# Patient Record
Sex: Female | Born: 1965 | Race: White | Hispanic: No | Marital: Married | Smoking: Never smoker
Health system: Southern US, Community
[De-identification: ages and names within clinical notes are randomized; demographics above are authoritative.]

## PROBLEM LIST (undated history)

## (undated) DIAGNOSIS — D509 Iron deficiency anemia, unspecified: Secondary | ICD-10-CM

## (undated) DIAGNOSIS — F329 Major depressive disorder, single episode, unspecified: Secondary | ICD-10-CM

## (undated) DIAGNOSIS — M549 Dorsalgia, unspecified: Secondary | ICD-10-CM

## (undated) HISTORY — DX: Dorsalgia, unspecified: M54.9

## (undated) HISTORY — DX: Major depressive disorder, single episode, unspecified: F32.9

## (undated) HISTORY — DX: Iron deficiency anemia, unspecified: D50.9

---

## 1999-09-10 ENCOUNTER — Encounter (INDEPENDENT_AMBULATORY_CARE_PROVIDER_SITE_OTHER): Payer: Self-pay | Admitting: Specialist

## 1999-09-10 ENCOUNTER — Inpatient Hospital Stay (HOSPITAL_COMMUNITY): Admission: AD | Admit: 1999-09-10 | Discharge: 1999-09-13 | Payer: Self-pay | Admitting: *Deleted

## 2003-09-09 ENCOUNTER — Encounter: Payer: Self-pay | Admitting: Family Medicine

## 2007-07-30 LAB — HM MAMMOGRAPHY

## 2007-07-30 LAB — CONVERTED CEMR LAB: Pap Smear: NORMAL

## 2009-08-24 ENCOUNTER — Ambulatory Visit: Payer: Self-pay | Admitting: Family Medicine

## 2009-08-24 DIAGNOSIS — F3289 Other specified depressive episodes: Secondary | ICD-10-CM

## 2009-08-24 DIAGNOSIS — F329 Major depressive disorder, single episode, unspecified: Secondary | ICD-10-CM

## 2009-08-24 DIAGNOSIS — D509 Iron deficiency anemia, unspecified: Secondary | ICD-10-CM

## 2009-08-24 HISTORY — DX: Other specified depressive episodes: F32.89

## 2009-08-24 HISTORY — DX: Major depressive disorder, single episode, unspecified: F32.9

## 2009-08-24 HISTORY — DX: Iron deficiency anemia, unspecified: D50.9

## 2009-08-31 ENCOUNTER — Telehealth: Payer: Self-pay | Admitting: Family Medicine

## 2009-09-15 ENCOUNTER — Telehealth: Payer: Self-pay | Admitting: Family Medicine

## 2009-10-07 ENCOUNTER — Emergency Department (HOSPITAL_COMMUNITY): Admission: EM | Admit: 2009-10-07 | Discharge: 2009-10-07 | Payer: Self-pay | Admitting: Emergency Medicine

## 2009-10-14 ENCOUNTER — Ambulatory Visit: Payer: Self-pay | Admitting: Family Medicine

## 2009-10-14 DIAGNOSIS — M549 Dorsalgia, unspecified: Secondary | ICD-10-CM | POA: Insufficient documentation

## 2009-10-14 HISTORY — DX: Dorsalgia, unspecified: M54.9

## 2009-10-27 ENCOUNTER — Telehealth: Payer: Self-pay | Admitting: Family Medicine

## 2009-11-15 ENCOUNTER — Encounter: Admission: RE | Admit: 2009-11-15 | Discharge: 2009-11-15 | Payer: Self-pay | Admitting: Obstetrics and Gynecology

## 2010-08-30 NOTE — Progress Notes (Signed)
Summary: FYI  Phone Note Call from Patient   Caller: Patient Call For: Kristina Peat MD Summary of Call: Pt has decided to say on the 20 mg. instead of the 10 th. Initial call taken by: Lynann Beaver CMA,  September 15, 2009 9:30 AM  Follow-up for Phone Call        noted Follow-up by: Kristina Peat MD,  September 15, 2009 12:36 PM

## 2010-08-30 NOTE — Progress Notes (Signed)
Summary: Prozac lower dose med request  Phone Note Call from Patient Call back at Home Phone [phone]   Caller: [facility] mail Summary of Call: Was seen recently and was rx'd  Generic Prozac 20mg .  She thinks that the mg is too much and she is not sleeping very well. She is requesting a new rx  10 mg dosage. Her pharmacy is [facility]. Initial call taken by: Warnell Forester,  August 31, 2009 9:02 AM  Follow-up for Phone Call        OK to reduce to 10 mg dose and refill for 6 months. Follow-up by: Evelena Peat MD,  August 31, 2009 10:28 AM  Additional Follow-up for Phone Call Additional follow up Details #1::        Rx change sent to pharmacy, pt informed Additional Follow-up by: Sid Falcon LPN,  August 31, 2009 11:18 AM    New/Updated Medications: FLUOXETINE HCL 10 MG CAPS (FLUOXETINE HCL) one tab daily Prescriptions: FLUOXETINE HCL 10 MG CAPS (FLUOXETINE HCL) one tab daily  #30 x 6   Entered by:   Sid Falcon LPN   Authorized by:   Evelena Peat MD   Signed by:   Sid Falcon LPN on 91/47/8295   Method used:   Electronically to        [facility]. [phone]* (retail)       [address]       [place], [place]  [address]       Ph: [phone]       Fax: [phone]   RxID:   [id number]
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
## 2010-08-30 NOTE — Assessment & Plan Note (Signed)
Summary: new to est//ccm   Vital Signs:  Patient profile:   45 year old female Menstrual status:  regular LMP:     08/18/2009 Height:      61.50 inches Weight:      127 pounds BMI:     23.69 Temp:     99.2 degrees F oral Pulse rate:   72 / minute Pulse rhythm:   regular Resp:     12 per minute BP sitting:   110 / 72  (left arm) Cuff size:   regular  Vitals Entered By: Sid Falcon LPN (August 24, 2009 9:09 AM) CC: New pt to establish, stress issues LMP (date): 08/18/2009     Menstrual Status regular Enter LMP: 08/18/2009 Last PAP Result normal   History of Present Illness: New patient to establish care.  Past history of depression which was treated several years ago. Recent increased stressors. She was remarried 2 years ago has 3 children and 2 stepchildren. Very stressful job. Feels overloaded at times. Previously treated with fluoxetine and would like to consider going back on that. Improved ability to manage stress in the past with that medication.  has had some recent early morning awakening. No suicidal ideation.  Remote hx of iron defic anemia during pregnancy.  No recent dizziness.  Prior C-section x3. Allergy to penicillin. No current medications. Family history significant for father having pituitary tumor. Grandparents with stroke.  Patient remarried 2008. No history of smoking. Occasional alcohol use.  Preventive Screening-Counseling & Management  Alcohol-Tobacco     Smoking Status: never  Caffeine-Diet-Exercise     Does Patient Exercise: no  Allergies (verified): 1)  Penicillin V Potassium (Penicillin V Potassium)  Past History:  Family History: Last updated: 08/24/2009 Family History of Alcoholism/Addiction, grandfather Family History of Arthritis, grandmother Stroke, grandmother, great grandfather  Social History: Last updated: 08/24/2009 Occupation: Married Never Smoked Alcohol use-yes Regular exercise-no Divorced 2005, remarried  2008  Risk Factors: Exercise: no (08/24/2009)  Risk Factors: Smoking Status: never (08/24/2009)  Past Medical History: Anemia-iron deficiency Depression Testing for HIV  Family History: Family History of Alcoholism/Addiction, grandfather Family History of Arthritis, grandmother Stroke, grandmother, great grandfather  Social History: Occupation: Married Never Smoked Alcohol use-yes Regular exercise-no Divorced 2005, remarried 2008 Occupation:  employed Smoking Status:  never Does Patient Exercise:  no  Review of Systems  The patient denies anorexia, fever, weight loss, weight gain, vision loss, hoarseness, chest pain, syncope, dyspnea on exertion, peripheral edema, prolonged cough, headaches, hemoptysis, abdominal pain, melena, hematochezia, and severe indigestion/heartburn.    Physical Exam  General:  Well-developed,well-nourished,in no acute distress; alert,appropriate and cooperative throughout examination Eyes:  No corneal or conjunctival inflammation noted. EOMI. Perrla. Funduscopic exam benign, without hemorrhages, exudates or papilledema. Vision grossly normal. Ears:  External ear exam shows no significant lesions or deformities.  Otoscopic examination reveals clear canals, tympanic membranes are intact bilaterally without bulging, retraction, inflammation or discharge. Hearing is grossly normal bilaterally. Mouth:  Oral mucosa and oropharynx without lesions or exudates.  Teeth in good repair. Neck:  No deformities, masses, or tenderness noted. Lungs:  Normal respiratory effort, chest expands symmetrically. Lungs are clear to auscultation, no crackles or wheezes. Heart:  Normal rate and regular rhythm. S1 and S2 normal without gallop, murmur, click, rub or other extra sounds. Psych:  normally interactive, good eye contact, and not anxious appearing.     Impression & Recommendations:  Problem # 1:  DEPRESSION (ICD-311) get back on prozac.  Discussed counseling but  she is  not interested at this time. Her updated medication list for this problem includes:    Fluoxetine Hcl 20 Mg Caps (Fluoxetine hcl) ..... One by mouth once daily  Problem # 2:  ANEMIA-IRON DEFICIENCY (ICD-280.9) Pt sees gyn and will get rechecked periodically there.  Complete Medication List: 1)  Fluoxetine Hcl 20 Mg Caps (Fluoxetine hcl) .... One by mouth once daily  Patient Instructions: 1)  It is important that you exercise reguarly at least 20 minutes 5 times a week. If you develop chest pain, have severe difficulty breathing, or feel very tired, stop exercising immediately and seek medical attention.  Prescriptions: FLUOXETINE HCL 20 MG CAPS (FLUOXETINE HCL) one by mouth once daily  #30 x 11   Entered and Authorized by:   Evelena Peat MD   Signed by:   Evelena Peat MD on 08/24/2009   Method used:   Electronically to        [facility]. [phone]* (retail)       [address]       [place], [place]  [address]       Ph: [phone]       Fax: [phone]   RxID:   [id number]   Preventive Care Screening  Mammogram:    Date:  07/30/2007    Results:  normal   Pap Smear:    Date:  07/30/2007    Results:  normal
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
## 2010-12-14 NOTE — Discharge Summary (Signed)
[facility]  Patient:    ALLEYReesha, Debes                        MRN: [id number] Adm. Date:  [id number] Disc. Date: [id number] Attending:  Donne Hazel Dictator:   Danie Chandler, R.N.                           Discharge Summary  ADMISSION DIAGNOSES:          1. Intrauterine pregnancy at term.                               2. Repeat cesarean section.                               3. Request permanent voluntary sterilization.  DISCHARGE DIAGNOSES:          1. Intrauterine pregnancy at term.                               2. Repeat cesarean section.                               3. Request permanent voluntary sterilization.                               4. Anemia.  PROCEDURE:                    On September 10, 1999, repeat low transverse cesarean                               section and bilateral tubal ligation.  REASON FOR ADMISSION:         The patient is a 44 year old married white female, gravida 4, para 2, at term, who is admitted for repeat cesarean delivery.  The patient has had two previous cesarean sections and declined a vaginal birth after cesarean; she also has requested voluntary sterilization at the time of the cesarean section.  HOSPITAL COURSE:              The patient is taken to the operating room and undergoes the above-named procedure without complication.  This is productive of a viable female infant with Apgars of 9 at one minute and 9 at five minutes. Postoperatively, on day #1, the patients only complaint was incisional pain. She had a good return of bowel function.  She was ambulating well without difficulty. Her hemoglobin on this day was 5.5, with a preoperative hemoglobin of 7.7.  Her  hematocrit was 18.1 and white blood cell count 7.9.  The patient was asymptomatic of the anemia.  She was started on iron b.i.d. and had a repeat CBC ordered for  September 12, 1999.  On postoperative day #2, the patient was  ambulating well without difficulty and tolerating a regular diet.  Her hemoglobin was stable at  5.4.  She was continued on iron and on postoperative day #3, the patient was discharged home in stable condition.  Her hemoglobin on this day was 6.1.  CONDITION ON DISCHARGE:  Good.  DIET:                         Regular as tolerated.  ACTIVITY:                     No heavy lifting, no driving, no vaginal entry.  FOLLOWUP:                     She is to follow up in the office in one to two weeks for incision check and she is to call for temperature greater than 100 degrees,  persistent nausea or vomiting, heavy vaginal bleeding and/or redness or drainage from the incision site.  DISCHARGE MEDICATIONS:        1. Prenatal vitamin one p.o. q.d.                               2. Tylox one to two every four hours as needed for                                  pain.                               3. Nu-Iron 150 mg one three times a day with meals. DD:  09/13/99 TD:  09/14/99 Job: [id number]

## 2010-12-14 NOTE — Op Note (Signed)
[facility]  Patient:    Kristina Sanchez, Kristina Sanchez                        MRN: [id number] Proc. Date: 09/10/99 Adm. Date:  [id number] Attending:  Donne Hazel                           Operative Report  PREOPERATIVE DIAGNOSIS:       Intrauterine pregnancy at term.  Repeat cesarean section.  Request permanent, voluntary sterilization.  POSTOPERATIVE DIAGNOSIS:      Intrauterine pregnancy at term.  Repeat cesarean section.  Request permanent, voluntary sterilization.  OPERATION:                    Repeat low transverse cesarean section. Bilateral tubal ligation.  SURGEON:                      Willey Blade, M.D.  ASSISTANT:                    [name], M.D.  ANESTHESIA:                   Spinal.  ESTIMATED BLOOD LOSS:         800 cc.  COMPLICATIONS:                None.  FINDINGS:                     At 22 through a low transverse uterine incision, a viable female infant was delivered without difficulty.  Apgars were 9 and 9. Weight is pending.  A bilateral tubal ligation was performed.  The pelvis was visualized at time of  surgery and otherwise noted to be normal.  DESCRIPTION OF PROCEDURE:     The patient was taken to the operating room where a spinal anesthetic was administered.  The patient was placed on the operating table in the left lateral tilt position.  The abdomen was prepped and draped in the usual sterile fashion with Betadine and sterile drapes.  A Foley catheter was sterilely inserted.  The abdomen was entered through a Pfannenstiel incision and carried own sharply in the usual fashion.  The peritoneum was atraumatically entered.  The vesicouterine peritoneum overlying the lower uterine segment was incised and a bladder flap was bluntly and sharply created over the lower uterine segment.  A  bladder blade was then placed behind the bladder.  The uterus was then entered through a low transverse incision and  carried out laterally using the operators  fingers.  The vertex was elevated into the incision and delivered promptly at 0855. The fluid was clear.  The oral and nasopharynx was thoroughly bulb suctioned. he cord was doubly clamped and cut and the baby handed promptly to the pediatricians. The baby did well and was a female with Apgars of 9 and 9.  Weight is pending. The placenta was then manually extracted intact with three vessel cord without difficulty and the interior of the uterus was wiped clean thoroughly with a wet  sponge.  The uterine incision was then closed in a two layer fashion, the first layer a running interlocking suture of #1 Vicryl.  A second imbricating suture was placed across the primary suture line with a running stitch of #1 Vicryl as well. Good hemostasis from the incision  was noted.  Bilateral tubal ligation was then performed.  The right tube was first identified and traced to its fimbriated end to ensure its positive identification.  The tube was then grasped in its midportion and through an avascular region in the mesosalpinx, a small hole was burned with the bipolar cautery.  Two sutures of 1 plain suture was then passed and an approximate 2.5 cm segment of tube was then  tied off.  An approximate 2 cm segment of tube was then excised between the existing ligatures of #1 plain.  Next, the tubal ends were cauterized using the  Bovie cautery.  A single suture of 0 silk was placed on the medial tubal stump.  The same procedure was thus repeated upon the left tube.  The pelvis was thoroughly irrigated with copious amounts of irrigant and good hemostasis was noted from all of the operative areas.  The rectus muscle and fascia was closed with a running  stitch of #1 Vicryl.  The subfascial areas were hemostatic.  The fascia was then closed with two sutures of #1 Vicryl, this was done by placing two sutures at the periphery of the incision and with  two running stitches meeting in the midline.  The subcutaneous tissue was irrigated and made hemostatic using the Bovie cautery. The skin reapproximated with staples and a sterile dressing applied.  Final sponge, needle, and instrument counts were correct x 3.  There were no perioperative complications.  The patient did receive Cefotan after cord clamp.  There were no perioperative complications. DD:  09/10/99 TD:  09/10/99 Job: [id number]

## 2010-12-14 NOTE — H&P (Signed)
[facility]  Patient:    Kristina Sanchez, Kristina Sanchez                        MRN: [id number] Adm. Date:  [id number] Attending:  Donne Hazel                         History and Physical  HISTORY OF PRESENT ILLNESS:   Ms. Kristina Sanchez is a 45 year old married female, gravida 4, para 2, A1, at term, admitted for repeat cesarean delivery.  The patient has had two previous cesarean sections and declined a vaginal birth after cesarean. She also requested a voluntary sterilization at time of her cesarean section.  MEDICAL HISTORY:              History of anemia.  SURGICAL HISTORY:             Cesarean section x 2.  OBSTETRICAL HISTORY:          1. TAB, 1993.                               2. Cesarean section in 1996 for CPD.                               3. A repeat cesarean section, 1998.  CURRENT MEDICATIONS:          Prenatal vitamins.  ALLERGIES:                    None known.  PHYSICAL EXAMINATION:  VITAL SIGNS:                  Stable -- temperature 97.3, pulse 86, respirations 20, blood pressure 127/72.  Fetal heart tones 140s.  GENERAL:                      She is a well-developed, well-nourished white female in no acute distress.  HEENT:                        Within normal limits.  NECK:                         Supple without adenopathy or thyromegaly.  HEART:                        Regular rate and rhythm, without murmur, gallop or rub.  LUNGS:                        Clear to auscultation.  BREASTS:                      Exam is deferred but has been normal within the last year.  ABDOMEN:                      Gravid, nontender, without masses or hernia.  EXTREMITIES:                  Grossly normal.  NEUROLOGIC:                   Grossly normal.  ADMITTING DIAGNOSES:  1. Intrauterine pregnancy at term.                               2. Repeat cesarean section.                               3. Declines vaginal birth after cesarean  section.                               4. Anemia.                               5. Requested permanent, voluntary sterilization.  PLAN:                         1. Repeat low transverse cesarean section.                               2. Bilateral tubal ligation.  DISCUSSION:                   Risks and benefits of surgery are explained to patient at length; also, the risk of tubal ligation explained.  A failure rate f 1 in 200 quoted.  The patient was allowed to ask questions and wished to proceed. DD:  09/10/99 TD:  09/10/99 Job: [id number]

## 2011-05-03 ENCOUNTER — Ambulatory Visit (INDEPENDENT_AMBULATORY_CARE_PROVIDER_SITE_OTHER): Payer: BC Managed Care – PPO | Admitting: Family Medicine

## 2011-05-03 ENCOUNTER — Encounter: Payer: Self-pay | Admitting: Family Medicine

## 2011-05-03 VITALS — BP 110/70 | Temp 98.1°F | Ht 62.0 in | Wt 133.0 lb

## 2011-05-03 DIAGNOSIS — M546 Pain in thoracic spine: Secondary | ICD-10-CM

## 2011-05-03 MED ORDER — CYCLOBENZAPRINE HCL 5 MG PO TABS: 5.0000 mg | ORAL_TABLET | Freq: Three times a day (TID) | ORAL | Status: AC | PRN

## 2011-05-03 NOTE — Patient Instructions (Signed)
Continue with topical heat, ibuprofen, and muscle massage.

## 2011-05-03 NOTE — Progress Notes (Signed)
  Subjective:    Patient ID: Kristina Sanchez, female    DOB: 05/31/1966, 45 y.o.   MRN: [id number]  HPI Upper back pain. Onset last week. Pain mostly left upper thoracic region. No injury. 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 No radiculopathy symptoms. No upper extremity weakness. Has not taken any medications. Increased muscle tension. Has previously received massages but not recently.   Review of Systems  Constitutional: Negative for fever and chills.  Respiratory: Negative for cough and shortness of breath.   Musculoskeletal: Positive for back pain.  Skin: Negative for rash.       Objective:   Physical Exam  Constitutional: She appears well-developed and well-nourished.  Cardiovascular: Normal rate and regular rhythm.   Pulmonary/Chest: Effort normal and breath sounds normal. No respiratory distress. She has no wheezes. She has no rales.  Musculoskeletal:       No spinal tenderness. Patient has some muscular tenderness and palpable muscle tension just medial to left scapula          Assessment & Plan:  Muscular pain left upper thoracic region. Try heat, ibuprofen, low-dose cyclobenzaprine, and muscle relaxer.

## 2011-10-26 ENCOUNTER — Ambulatory Visit (INDEPENDENT_AMBULATORY_CARE_PROVIDER_SITE_OTHER): Payer: BC Managed Care – PPO | Admitting: Family Medicine

## 2011-10-26 ENCOUNTER — Encounter: Payer: Self-pay | Admitting: Family Medicine

## 2011-10-26 VITALS — BP 110/84 | Temp 98.6°F | Wt 131.0 lb

## 2011-10-26 DIAGNOSIS — M25511 Pain in right shoulder: Secondary | ICD-10-CM

## 2011-10-26 DIAGNOSIS — M25519 Pain in unspecified shoulder: Secondary | ICD-10-CM

## 2011-10-26 NOTE — Patient Instructions (Signed)
Rotator Cuff Tendonitis  The rotator cuff is the collection of all the muscles and tendons (the supraspinatus, infraspinatus, subscapularis, and teres minor muscles and their tendons) that help your shoulder stay in place. This unit holds the head of the upper arm bone (humerus) in the cup (fossa) of the shoulder blade (scapula). Basically, it connects the arm to the shoulder. Tendinitis is a swelling and irritation of the tissue, called cord like structures (tendons) that connect muscle to bone. It usually is caused by overusing the joint involved. When the tissue surrounding a tendon (the synovium) becomes inflamed, it is called tenosynovitis. This also is often the result of overuse in people whose jobs require repetitive (over and over again) types of motion. HOME CARE INSTRUCTIONS   Use a sling or splint for as long as directed by your caregiver until the pain decreases.   Apply ice to the injury for 15 to 20 minutes, 3 to 4 times per day. Put the ice in a plastic bag and place a towel between the bag of ice and your skin.   Try to avoid use other than gentle range of motion while your shoulder is painful. Use and exercise only as directed by your caregiver. Stop exercises or range of motion if pain or discomfort increases, unless directed otherwise by your caregiver.   Only take over-the-counter or prescription medicines for pain, discomfort, or fever as directed by your caregiver.   If you were give a shoulder sling and straps (immobilizer), do not remove it except as directed, or until you see a caregiver for a follow-up examination. If you need to remove it, move your arm as little as possible or as directed.   You may want to sleep on several pillows at night to lessen swelling and pain.  SEEK IMMEDIATE MEDICAL CARE IF:   Pain in your shoulder increases or new pain develops in your arm, hand, or fingers and is not relieved with medications.   You develop new, unexplained symptoms,  especially increased numbness in the hands or loss of strength, or you develop any worsening of the problems which brought you in for care.   Your arm, hand, or fingers are numb or tingling.   Your arm, hand, or fingers are swollen, painful, or turn white or blue.  Document Released: 10/05/2003 Document Revised: 07/04/2011 Document Reviewed: 05/12/2008 [employer] Patient Information 2012 [employer]. 

## 2011-10-26 NOTE — Progress Notes (Signed)
  Subjective:    Patient ID: Kristina Sanchez, female    DOB: 02-Aug-1965, 46 y.o.   MRN: [id number]  HPI  Patient seen with right shoulder and upper arm pain for one month. No injury. No neck pain. Denies any numbness or weakness. Pain with abduction of shoulder predominately. Pain is sharp and occasional burning type quality mostly radiating to deltoid and occasionally below the elbow. Pain is relatively constant. Worse with abduction and somewhat external rotation. No clear weakness. She's tried occasional Aleve and Advil without relief   Review of Systems  HENT: Negative for neck pain.   Cardiovascular: Negative for chest pain.  Skin: Negative for rash.  Neurological: Negative for weakness and numbness.  Hematological: Negative for adenopathy.       Objective:   Physical Exam  Constitutional: She appears well-developed and well-nourished.  Cardiovascular: Normal rate and regular rhythm.   Pulmonary/Chest: Effort normal and breath sounds normal. No respiratory distress. She has no wheezes. She has no rales.  Musculoskeletal:       Patient has good range of motion right shoulder but pain with abduction greater than 90. Nonspecific tenderness right proximal humerus region. No ecchymosis. No erythema. No edema.  Neurological:       No rotator cuff weakness appreciated          Assessment & Plan:  Right shoulder pain. Suspect rotator cuff tendinitis.  Discussed risks and benefits of corticosteroid injection and patient consented.  After prepping skin with betadine, injected 40 mg depomedrol and 2 cc of plain xylocaine with 23 gauge one and one half inch needle using posterior lateral approach and pt tolerate well.  Gentle range of motion and touch base in one week if no better.  Consider cervical spine evaluation if no improvement with injection.

## 2011-11-07 ENCOUNTER — Telehealth: Payer: Self-pay | Admitting: Family Medicine

## 2011-11-07 DIAGNOSIS — M25519 Pain in unspecified shoulder: Secondary | ICD-10-CM

## 2011-11-07 NOTE — Telephone Encounter (Signed)
Pt went to the Saturday clinic 2 weeks ago, but her shoulder has continued to hurt, and thinks it is getting worse.

## 2011-11-07 NOTE — Telephone Encounter (Signed)
I called pt back as requested, she is having trouble reaching into her closet, she is open to a referral if Dr Caryl Never feels that is the next step.

## 2011-11-07 NOTE — Telephone Encounter (Signed)
Pt called and said that the cortisone shot only helped for a few hrs and that's all. Pt said that she is still having pain in upper arm and shoulder. Pt can't reach for anything or extend her arm. Pt req call back from nurse.

## 2011-11-08 NOTE — Telephone Encounter (Signed)
May have cervical nerve root impingement.  I will go ahead with ortho referral and let pt know.

## 2011-11-08 NOTE — Telephone Encounter (Signed)
Pt informed, she has a new insurance card and wanted to fax a copy, she will attention it to [place]. FYI

## 2012-06-04 ENCOUNTER — Ambulatory Visit (INDEPENDENT_AMBULATORY_CARE_PROVIDER_SITE_OTHER): Payer: BC Managed Care – PPO | Admitting: Family Medicine

## 2012-06-04 ENCOUNTER — Encounter: Payer: Self-pay | Admitting: Family Medicine

## 2012-06-04 VITALS — BP 110/74 | Temp 100.3°F | Wt 128.0 lb

## 2012-06-04 DIAGNOSIS — J029 Acute pharyngitis, unspecified: Secondary | ICD-10-CM

## 2012-06-04 MED ORDER — HYDROCODONE-HOMATROPINE 5-1.5 MG/5ML PO SYRP: 5.0000 mL | ORAL_SOLUTION | Freq: Four times a day (QID) | ORAL | Status: AC | PRN

## 2012-06-04 NOTE — Patient Instructions (Addendum)

## 2012-06-04 NOTE — Progress Notes (Signed)
Subjective:     Patient ID: Kristina Sanchez, female   DOB: Nov 02, 1965, 46 y.o.   MRN: [id number]  HPI 46 year old with 1 day history of cough, sore throat, and body aches.  States that cough began ~5pm yesterday, followed by sore throat, with hip, knee and thigh muscle aches.  Did not check temperature at home, but has noted periods of heat and cold intolerance.  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  Denies SOB, CP, nausea, vomiting, diarrhea.  Has not had flu shot this year.  Review of Systems  Constitutional: Positive for fever and chills.  HENT: Positive for sore throat.   Respiratory: Positive for cough.   Musculoskeletal: Positive for myalgias and arthralgias.       Objective:   Physical Exam  Constitutional: She appears well-developed and well-nourished.  HENT:  Head: Normocephalic and atraumatic.  Right Ear: External ear normal.  Left Ear: External ear normal.  Mouth/Throat: Oropharynx is clear and moist.       Few scattered petechiae on soft palate.  Neck: Neck supple.  Cardiovascular: Normal rate, regular rhythm and normal heart sounds.   Pulmonary/Chest: Effort normal and breath sounds normal. No respiratory distress. She has no wheezes. She has no rales.  Lymphadenopathy:    She has no cervical adenopathy.       Assessment:     46 year old with 1 day history of cough, sore throat and body aches.    Plan:     1. Cough/sore throat: likely viral URI.  Despite not having flu shot, still early in season to be the flu.  Pt also had sick contacts at jury duty earlier this week.  Recommend plenty of clear fluids, symptomatic relief of cough, arthralgias/myalgias, and fever.  Rx Hycodan for nighttime cough suppression, and can take ibuprofen or acetaminophen for fever reduction.    Marthann Schiller, MS3     Agree with assessment and plan as per Marthann Schiller, MS 3 Evelena Peat MD

## 2013-11-08 ENCOUNTER — Ambulatory Visit (INDEPENDENT_AMBULATORY_CARE_PROVIDER_SITE_OTHER): Payer: BC Managed Care – PPO | Admitting: Family Medicine

## 2013-11-08 ENCOUNTER — Encounter: Payer: Self-pay | Admitting: Family Medicine

## 2013-11-08 VITALS — BP 110/64 | HR 82 | Temp 98.2°F | Wt 130.0 lb

## 2013-11-08 DIAGNOSIS — J069 Acute upper respiratory infection, unspecified: Secondary | ICD-10-CM

## 2013-11-08 DIAGNOSIS — B9789 Other viral agents as the cause of diseases classified elsewhere: Principal | ICD-10-CM

## 2013-11-08 MED ORDER — HYDROCODONE-HOMATROPINE 5-1.5 MG/5ML PO SYRP: 5.0000 mL | ORAL_SOLUTION | Freq: Four times a day (QID) | ORAL | Status: AC | PRN

## 2013-11-08 NOTE — Patient Instructions (Signed)
Acute Bronchitis Bronchitis is inflammation of the airways that extend from the windpipe into the lungs (bronchi). The inflammation often causes mucus to develop. This leads to a cough, which is the most common symptom of bronchitis.  In acute bronchitis, the condition usually develops suddenly and goes away over time, usually in a couple weeks. Smoking, allergies, and asthma can make bronchitis worse. Repeated episodes of bronchitis may cause further lung problems.  CAUSES Acute bronchitis is most often caused by the same virus that causes a cold. The virus can spread from person to person (contagious).  SIGNS AND SYMPTOMS   Cough.   Fever.   Coughing up mucus.   Body aches.   Chest congestion.   Chills.   Shortness of breath.   Sore throat.  DIAGNOSIS  Acute bronchitis is usually diagnosed through a physical exam. Tests, such as chest X-rays, are sometimes done to rule out other conditions.  TREATMENT  Acute bronchitis usually goes away in a couple weeks. Often times, no medical treatment is necessary. Medicines are sometimes given for relief of fever or cough. Antibiotics are usually not needed but may be prescribed in certain situations. In some cases, an inhaler may be recommended to help reduce shortness of breath and control the cough. A cool mist vaporizer may also be used to help thin bronchial secretions and make it easier to clear the chest.  HOME CARE INSTRUCTIONS  Get plenty of rest.   Drink enough fluids to keep your urine clear or pale yellow (unless you have a medical condition that requires fluid restriction). Increasing fluids may help thin your secretions and will prevent dehydration.   Only take over-the-counter or prescription medicines as directed by your health care provider.   Avoid smoking and secondhand smoke. Exposure to cigarette smoke or irritating chemicals will make bronchitis worse. If you are a smoker, consider using nicotine gum or skin  patches to help control withdrawal symptoms. Quitting smoking will help your lungs heal faster.   Reduce the chances of another bout of acute bronchitis by washing your hands frequently, avoiding people with cold symptoms, and trying not to touch your hands to your mouth, nose, or eyes.   Follow up with your health care provider as directed.  SEEK MEDICAL CARE IF: Your symptoms do not improve after 1 week of treatment.  SEEK IMMEDIATE MEDICAL CARE IF:  You develop an increased fever or chills.   You have chest pain.   You have severe shortness of breath.  You have bloody sputum.   You develop dehydration.  You develop fainting.  You develop repeated vomiting.  You develop a severe headache. MAKE SURE YOU:   Understand these instructions.  Will watch your condition.  Will get help right away if you are not doing well or get worse. Document Released: 08/22/2004 Document Revised: 03/17/2013 Document Reviewed: 01/05/2013 [employer] Patient Information 2014 [employer]  

## 2013-11-08 NOTE — Progress Notes (Signed)
   Subjective:    Patient ID: Kristina Sanchez, female    DOB: 02/16/1966, 48 y.o.   MRN: [id number]  Cough Pertinent negatives include no chills, fever or sore throat.   Acute visit. Patient seen with about 4 day history of cough, nasal congestion, increased fatigue. No reported fever or chills. Cough has been mostly nonproductive and worse at night. She's tried over-the-counter cough medications without much improvement. She initially thought this may been allergies but she has taken Zyrtec and Claritin without relief. Hycodan which did help her nighttime cough. She has generally not had a history of spring allergies. Nonsmoker.  Past Medical History  Diagnosis Date  . ANEMIA-IRON DEFICIENCY 08/24/2009    Qualifier: Diagnosis of  By: Caryl NeverBurchette MD, Chany Woolworth    . DEPRESSION 08/24/2009    Qualifier: Diagnosis of  By: Caryl NeverBurchette MD, Nyheem Binette    . BACK PAIN, UPPER 10/14/2009    Qualifier: Diagnosis of  By: Caryl NeverBurchette MD, Zavannah Deblois     No past surgical history on file.  reports that she has never smoked. She does not have any smokeless tobacco history on file. Her alcohol and drug histories are not on file. family history includes Alcohol abuse in her other; Arthritis in her other; Stroke in her other. Allergies  Allergen Reactions  . Penicillins     REACTION: hives      Review of Systems  Constitutional: Positive for fatigue. Negative for fever and chills.  HENT: Positive for congestion. Negative for sore throat.   Respiratory: Positive for cough.        Objective:   Physical Exam  Constitutional: She appears well-developed and well-nourished.  HENT:  Right Ear: External ear normal.  Left Ear: External ear normal.  Mouth/Throat: Oropharynx is clear and moist.  Neck: Neck supple.  Cardiovascular: Normal rate.   Pulmonary/Chest: Effort normal and breath sounds normal. No respiratory distress. She has no wheezes. She has no rales.  Lymphadenopathy:    She has no cervical adenopathy.           Assessment & Plan:  Viral URI with cough.  Non focal exam.  Hycodan cough syrup prn severe cough at night.

## 2013-11-08 NOTE — Progress Notes (Signed)
Pre visit review using our clinic review tool, if applicable. No additional management support is needed unless otherwise documented below in the visit note. 

## 2015-06-28 ENCOUNTER — Ambulatory Visit (INDEPENDENT_AMBULATORY_CARE_PROVIDER_SITE_OTHER): Payer: BLUE CROSS/BLUE SHIELD | Admitting: Family Medicine

## 2015-06-28 VITALS — BP 120/84 | HR 85 | Temp 99.0°F | Resp 16 | Ht 62.0 in | Wt 138.4 lb

## 2015-06-28 DIAGNOSIS — M546 Pain in thoracic spine: Secondary | ICD-10-CM | POA: Diagnosis not present

## 2015-06-28 DIAGNOSIS — M549 Dorsalgia, unspecified: Secondary | ICD-10-CM

## 2015-06-28 DIAGNOSIS — L720 Epidermal cyst: Secondary | ICD-10-CM

## 2015-06-28 DIAGNOSIS — H9202 Otalgia, left ear: Secondary | ICD-10-CM

## 2015-06-28 DIAGNOSIS — L989 Disorder of the skin and subcutaneous tissue, unspecified: Secondary | ICD-10-CM | POA: Diagnosis not present

## 2015-06-28 NOTE — Progress Notes (Signed)
   Subjective:    Patient ID: Kristina Sanchez, female    DOB: 01/13/1966, 49 y.o.   MRN: [id number]  HPI Patient here today for multiple issues  Left earache. Onset about 4 days ago. She's had some mild nasal congestion. She takes Aleve which helps. No ear drainage. No hearing changes. No dizziness or vertigo. Denies any fever or chills. Has had some mild sore throat and occasional dry cough.  Nodular skin lesion left temporal area of face. Present for several months. Couple episodes of reported bleeding without any provoking trauma. No personal history of skin cancer.  Cystic lesion mid thoracic back. No pain. No drainage.  Chronic left upper back pain. Occasional radiation toward base of neck. No radiculopathy symptoms. She's tried multiple things including heat, ice, muscle massage without relief. Pain somewhat poorly localized.  Past Medical History  Diagnosis Date  . ANEMIA-IRON DEFICIENCY 08/24/2009    Qualifier: Diagnosis of  By: Caryl NeverBurchette MD, Zakkiyya Barno    . DEPRESSION 08/24/2009    Qualifier: Diagnosis of  By: Caryl NeverBurchette MD, Geremy Rister    . BACK PAIN, UPPER 10/14/2009    Qualifier: Diagnosis of  By: Caryl NeverBurchette MD, Ariana Cavenaugh     No past surgical history on file.  reports that she has never smoked. She does not have any smokeless tobacco history on file. Her alcohol and drug histories are not on file. family history includes Alcohol abuse in her other; Arthritis in her other; Stroke in her other. Allergies  Allergen Reactions  . Penicillins     REACTION: hives      Review of Systems  Constitutional: Negative for appetite change and unexpected weight change.  HENT: Positive for congestion and ear pain. Negative for hearing loss.   Respiratory: Positive for cough. Negative for shortness of breath.   Cardiovascular: Negative for chest pain.  Neurological: Negative for dizziness and headaches.       Objective:   Physical Exam  Constitutional: She appears well-developed and well-nourished.    HENT:  Right Ear: External ear normal.  Left Ear: External ear normal.  Mouth/Throat: Oropharynx is clear and moist.  Neck: Neck supple. No thyromegaly present.  Cardiovascular: Normal rate and regular rhythm.   Pulmonary/Chest: Effort normal and breath sounds normal. No respiratory distress. She has no wheezes. She has no rales.  Musculoskeletal: She exhibits no edema.  Lymphadenopathy:    She has no cervical adenopathy.  Skin:  Small cystic lesion mid thoracic back. Approximately 1 cm diameter. Nonfluctuant. No erythema. Nontender.  Somewhat linear nodular fleshy colored skin lesion left temporal region about 8 mm in length. No ulceration          Assessment & Plan:  #1 left otalgia. Completely normal exam. Etiology uncertain. Consider over-the-counter Flonase for nasal congestive symptoms. Follow-up as needed #2 nodular skin lesion left face. Set up referral to skin surgery Center. #3 benign epidermal cyst mid back- reassurance.  Explained we could excise but recommend leaving alone unless any problems. #4 chronic left upper back pain. Not relieved with conservative treatments. Set up sports medicine assessment

## 2015-06-28 NOTE — Progress Notes (Signed)
Pre visit review using our clinic review tool, if applicable. No additional management support is needed unless otherwise documented below in the visit note. 

## 2015-06-28 NOTE — Patient Instructions (Signed)
Earache An earache, also called otalgia, can be caused by many things. Pain from an earache can be sharp, dull, or burning. The pain may be temporary or constant. Earaches can be caused by problems with the ear, such as infection in either the middle ear or the ear canal, injury, impacted ear wax, middle ear pressure, or a foreign body in the ear. Ear pain can also result from problems in other areas. This is called referred pain. For example, pain can come from a sore throat, a tooth infection, or problems with the jaw or the joint between the jaw and the skull (temporomandibular joint, or TMJ). The cause of an earache is not always easy to identify. Watchful waiting may be appropriate for some earaches until a clear cause of the pain can be found. HOME CARE INSTRUCTIONS Watch your condition for any changes. The following actions may help to lessen any discomfort that you are feeling:  Take medicines only as directed by your health care provider. This includes ear drops.  Apply ice to your outer ear to help reduce pain.  Put ice in a plastic bag.  Place a towel between your skin and the bag.  Leave the ice on for 20 minutes, 2-3 times per day.  Do not put anything in your ear other than medicine that is prescribed by your health care provider.  Try resting in an upright position instead of lying down. This may help to reduce pressure in the middle ear and relieve pain.  Chew gum if it helps to relieve your ear pain.  Control any allergies that you have.  Keep all follow-up visits as directed by your health care provider. This is important. SEEK MEDICAL CARE IF:  Your pain does not improve within 2 days.  You have a fever.  You have new or worsening symptoms. SEEK IMMEDIATE MEDICAL CARE IF:  You have a severe headache.  You have a stiff neck.  You have difficulty swallowing.  You have redness or swelling behind your ear.  You have drainage from your ear.  You have hearing  loss.  You feel dizzy.   This information is not intended to replace advice given to you by your health care provider. Make sure you discuss any questions you have with your health care provider.   Document Released: 03/01/2004 Document Revised: 08/05/2014 Document Reviewed: 02/13/2014 Elsevier Interactive Patient Education 2016 [employer]  

## 2015-07-11 ENCOUNTER — Ambulatory Visit (INDEPENDENT_AMBULATORY_CARE_PROVIDER_SITE_OTHER): Payer: BLUE CROSS/BLUE SHIELD | Admitting: Family Medicine

## 2015-07-11 ENCOUNTER — Encounter: Payer: Self-pay | Admitting: *Deleted

## 2015-07-11 ENCOUNTER — Encounter: Payer: Self-pay | Admitting: Family Medicine

## 2015-07-11 VITALS — BP 120/80 | HR 66 | Ht 62.0 in | Wt 139.0 lb

## 2015-07-11 DIAGNOSIS — M9901 Segmental and somatic dysfunction of cervical region: Secondary | ICD-10-CM | POA: Diagnosis not present

## 2015-07-11 DIAGNOSIS — M940 Chondrocostal junction syndrome [Tietze]: Secondary | ICD-10-CM | POA: Diagnosis not present

## 2015-07-11 DIAGNOSIS — M999 Biomechanical lesion, unspecified: Secondary | ICD-10-CM | POA: Insufficient documentation

## 2015-07-11 DIAGNOSIS — M9902 Segmental and somatic dysfunction of thoracic region: Secondary | ICD-10-CM | POA: Diagnosis not present

## 2015-07-11 DIAGNOSIS — M9908 Segmental and somatic dysfunction of rib cage: Secondary | ICD-10-CM | POA: Diagnosis not present

## 2015-07-11 DIAGNOSIS — M357 Hypermobility syndrome: Secondary | ICD-10-CM

## 2015-07-11 DIAGNOSIS — Q796 Ehlers-Danlos syndrome: Secondary | ICD-10-CM

## 2015-07-11 MED ORDER — DICLOFENAC SODIUM 2 % TD SOLN: TRANSDERMAL | Status: DC

## 2015-07-11 NOTE — Patient Instructions (Addendum)
Good to see you Kristina Sanchez 20 minutes 2 times daily. Usually after activity and before bed. Exercises 3 times a week.  Tennis ball between shoulder blades with sitting long amount of time On wall with heels, butt shoulder and head touching for a goal of 5 minutes daily  Keep monitor at eye level.  Ergonomics at work and give them the letter.  Vitamin D 2000 IU daily Turmeric 500mg  twice daily pennsaid pinkie amount topically 2 times daily as needed.  Keep left arm down when sleeping.  See me again in 3 weeks.  Happy holidays!    Standing:  Secure a rubber exercise band/tubing so that it is at the height of your shoulders when you are either standing or sitting on a firm arm-less chair.  Grasp an end of the band/tubing in each hand and have your palms face each other. Straighten your elbows and lift your hands straight in front of you at shoulder height. Step back away from the secured end of band/tubing until it becomes tense.  Squeeze your shoulder blades together. Keeping your elbows locked and your hands at shoulder-height, bring your hands out to your side.  Hold __________ seconds. Slowly ease the tension on the band/tubing as you reverse the directions and return to the starting position. Repeat __________ times. Complete this exercise __________ times per day. STRENGTH - Scapular Retractors  Secure a rubber exercise band/tubing so that it is at the height of your shoulders when you are either standing or sitting on a firm arm-less chair.  With a palm-down grip, grasp an end of the band/tubing in each hand. Straighten your elbows and lift your hands straight in front of you at shoulder height. Step back away from the secured end of band/tubing until it becomes tense.  Squeezing your shoulder blades together, draw your elbows back as you bend them. Keep your upper arm lifted away from your body throughout the exercise.  Hold __________ seconds. Slowly ease the tension on the  band/tubing as you reverse the directions and return to the starting position. Repeat __________ times. Complete this exercise __________ times per day. STRENGTH - Shoulder Extensors   Secure a rubber exercise band/tubing so that it is at the height of your shoulders when you are either standing or sitting on a firm arm-less chair.  With a thumbs-up grip, grasp an end of the band/tubing in each hand. Straighten your elbows and lift your hands straight in front of you at shoulder height. Step back away from the secured end of band/tubing until it becomes tense.  Squeezing your shoulder blades together, pull your hands down to the sides of your thighs. Do not allow your hands to go behind you.  Hold for __________ seconds. Slowly ease the tension on the band/tubing as you reverse the directions and return to the starting position. Repeat __________ times. Complete this exercise __________ times per day.  STRENGTH - Scapular Retractors and External Rotators  Secure a rubber exercise band/tubing so that it is at the height of your shoulders when you are either standing or sitting on a firm arm-less chair.  With a palm-down grip, grasp an end of the band/tubing in each hand. Bend your elbows 90 degrees and lift your elbows to shoulder height at your sides. Step back away from the secured end of band/tubing until it becomes tense.  Squeezing your shoulder blades together, rotate your shoulder so that your upper arm and elbow remain stationary, but your fists travel upward to head-height.  Hold __________ for seconds. Slowly ease the tension on the band/tubing as you reverse the directions and return to the starting position. Repeat __________ times. Complete this exercise __________ times per day.  STRENGTH - Scapular Retractors and External Rotators, Rowing  Secure a rubber exercise band/tubing so that it is at the height of your shoulders when you are either standing or sitting on a firm arm-less  chair.  With a palm-down grip, grasp an end of the band/tubing in each hand. Straighten your elbows and lift your hands straight in front of you at shoulder height. Step back away from the secured end of band/tubing until it becomes tense.  Step 1: Squeeze your shoulder blades together. Bending your elbows, draw your hands to your chest as if you are rowing a boat. At the end of this motion, your hands and elbow should be at shoulder-height and your elbows should be out to your sides.  Step 2: Rotate your shoulder to raise your hands above your head. Your forearms should be vertical and your upper-arms should be horizontal.  Hold for __________ seconds. Slowly ease the tension on the band/tubing as you reverse the directions and return to the starting position. Repeat __________ times. Complete this exercise __________ times per day.  STRENGTH - Scapular Retractors and Elevators  Secure a rubber exercise band/tubing so that it is at the height of your shoulders when you are either standing or sitting on a firm arm-less chair.  With a thumbs-up grip, grasp an end of the band/tubing in each hand. Step back away from the secured end of band/tubing until it becomes tense.  Squeezing your shoulder blades together, straighten your elbows and lift your hands straight over your head.  Hold for __________ seconds. Slowly ease the tension on the band/tubing as you reverse the directions and return to the starting position. Repeat __________ times. Complete this exercise __________ times per day.    This information is not intended to replace advice given to you by your health care provider. Make sure you discuss any questions you have with your health care provider.   Document Released: 07/15/2005 Document Revised: 11/29/2014 Document Reviewed: 10/27/2008 Elsevier Interactive Patient Education [employer]

## 2015-07-11 NOTE — Progress Notes (Signed)
Pre visit review using our clinic review tool, if applicable. No additional management support is needed unless otherwise documented below in the visit note. 

## 2015-07-11 NOTE — Assessment & Plan Note (Signed)
Decision today to treat with OMT was based on Physical Exam  After verbal consent patient was treated with HVLA, ME techniques in cervical, thoracic and rib areas  Patient tolerated the procedure well with improvement in symptoms  Patient given exercises, stretches and lifestyle modifications  See medications in patient instructions if given  Patient will follow up in 3 weeks  
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
## 2015-07-11 NOTE — Assessment & Plan Note (Signed)
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 We discussed ergonomics throughout the day as well as posture. Patient given home exercises. We are going to have patient have an ergonomic evaluation at work. We discussed icing regimen. Topical anti-inflammatories prescribed. Patient will try over-the-counter natural supplementations and we discussed strengthening versus flexibility. Patient and will come back and see me again in 3 weeks for further treatment and evaluation.

## 2015-08-02 ENCOUNTER — Ambulatory Visit (INDEPENDENT_AMBULATORY_CARE_PROVIDER_SITE_OTHER): Payer: BLUE CROSS/BLUE SHIELD | Admitting: Family Medicine

## 2015-08-02 ENCOUNTER — Encounter: Payer: Self-pay | Admitting: Family Medicine

## 2015-08-02 VITALS — BP 102/70 | HR 78 | Ht 62.0 in | Wt 139.0 lb

## 2015-08-02 DIAGNOSIS — M9908 Segmental and somatic dysfunction of rib cage: Secondary | ICD-10-CM | POA: Diagnosis not present

## 2015-08-02 DIAGNOSIS — M9901 Segmental and somatic dysfunction of cervical region: Secondary | ICD-10-CM | POA: Diagnosis not present

## 2015-08-02 DIAGNOSIS — M940 Chondrocostal junction syndrome [Tietze]: Secondary | ICD-10-CM

## 2015-08-02 DIAGNOSIS — M999 Biomechanical lesion, unspecified: Secondary | ICD-10-CM

## 2015-08-02 DIAGNOSIS — M9902 Segmental and somatic dysfunction of thoracic region: Secondary | ICD-10-CM | POA: Diagnosis not present

## 2015-08-02 MED ORDER — MELOXICAM 15 MG PO TABS: 15.0000 mg | ORAL_TABLET | Freq: Every day | ORAL | Status: DC
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
## 2015-08-02 NOTE — Assessment & Plan Note (Signed)
Continues to have difficulty secondary to hypermobility syndrome as well as patient's breast tissue. Patient is cut having ergonomic evaluation at work which I think will be beneficial. We discussed icing regimen. Patient is going to try to increase her activity. Patient come back and see me again in 3-4 weeks. Responded better today to many position.

## 2015-08-02 NOTE — Progress Notes (Signed)
Pre visit review using our clinic review tool, if applicable. No additional management support is needed unless otherwise documented below in the visit note. 

## 2015-08-02 NOTE — Assessment & Plan Note (Signed)
Decision today to treat with OMT was based on Physical Exam  After verbal consent patient was treated with HVLA, ME techniques in cervical, thoracic and rib areas  Patient tolerated the procedure well with improvement in symptoms  Patient given exercises, stretches and lifestyle modifications  See medications in patient instructions if given  Patient will follow up in 3 weeks  

## 2015-08-02 NOTE — Patient Instructions (Signed)
Good to see you Ice can be your friend Melxoicam daily for 3 days then as needed Keep it up I think you are doing well.  [facility] See me again in 3-4 weeks.  Happy New Year!

## 2015-08-30 ENCOUNTER — Encounter: Payer: Self-pay | Admitting: Family Medicine

## 2015-08-30 ENCOUNTER — Ambulatory Visit (INDEPENDENT_AMBULATORY_CARE_PROVIDER_SITE_OTHER): Payer: BLUE CROSS/BLUE SHIELD | Admitting: Family Medicine

## 2015-08-30 VITALS — BP 118/78 | HR 105 | Wt 139.0 lb

## 2015-08-30 DIAGNOSIS — M9902 Segmental and somatic dysfunction of thoracic region: Secondary | ICD-10-CM | POA: Diagnosis not present

## 2015-08-30 DIAGNOSIS — M9901 Segmental and somatic dysfunction of cervical region: Secondary | ICD-10-CM | POA: Diagnosis not present

## 2015-08-30 DIAGNOSIS — M9908 Segmental and somatic dysfunction of rib cage: Secondary | ICD-10-CM | POA: Diagnosis not present

## 2015-08-30 DIAGNOSIS — M940 Chondrocostal junction syndrome [Tietze]: Secondary | ICD-10-CM

## 2015-08-30 DIAGNOSIS — M999 Biomechanical lesion, unspecified: Secondary | ICD-10-CM

## 2015-08-30 NOTE — Assessment & Plan Note (Signed)
Decision today to treat with OMT was based on Physical Exam  After verbal consent patient was treated with HVLA, ME techniques in cervical, thoracic and rib areas  Patient tolerated the procedure well with improvement in symptoms  Patient given exercises, stretches and lifestyle modifications  See medications in patient instructions if given  Patient will follow up in 4-6 weeks                                           

## 2015-08-30 NOTE — Assessment & Plan Note (Signed)
Continues to be difficult. Secondary to muscle imbalances, posture, as well as patient's breast tissue. Encourage patient to continue to work on upper back exercises and strengthening. We discussed icing regimen. Discussed the importance of the or numbness at work and patient states that she should be getting a new disc in the next 2-3 weeks. Patient will continue to be active and work on her other activities. Patient and will come back and see me again in 4-6 weeks for further evaluation and treatment.

## 2015-08-30 NOTE — Patient Instructions (Signed)
Good to see you  Ice is your friend Stay active and keep working on the posture Can't wait for you to get the right desk See me again in 4 weeks.
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
## 2015-09-28 ENCOUNTER — Ambulatory Visit: Payer: BLUE CROSS/BLUE SHIELD | Admitting: Family Medicine

## 2015-09-29 ENCOUNTER — Ambulatory Visit: Payer: BLUE CROSS/BLUE SHIELD | Admitting: Family Medicine

## 2015-10-11 ENCOUNTER — Ambulatory Visit (INDEPENDENT_AMBULATORY_CARE_PROVIDER_SITE_OTHER): Payer: BLUE CROSS/BLUE SHIELD | Admitting: Family Medicine

## 2015-10-11 ENCOUNTER — Encounter: Payer: Self-pay | Admitting: Family Medicine

## 2015-10-11 VITALS — BP 122/80 | HR 73 | Wt 140.0 lb

## 2015-10-11 DIAGNOSIS — M9903 Segmental and somatic dysfunction of lumbar region: Secondary | ICD-10-CM | POA: Diagnosis not present

## 2015-10-11 DIAGNOSIS — M9901 Segmental and somatic dysfunction of cervical region: Secondary | ICD-10-CM

## 2015-10-11 DIAGNOSIS — M940 Chondrocostal junction syndrome [Tietze]: Secondary | ICD-10-CM

## 2015-10-11 DIAGNOSIS — M999 Biomechanical lesion, unspecified: Secondary | ICD-10-CM

## 2015-10-11 DIAGNOSIS — M9902 Segmental and somatic dysfunction of thoracic region: Secondary | ICD-10-CM

## 2015-10-11 DIAGNOSIS — M9908 Segmental and somatic dysfunction of rib cage: Secondary | ICD-10-CM
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
## 2015-10-11 NOTE — Assessment & Plan Note (Signed)
Continues to have difficulty. We discussed once again the breast tissue can play a role as well as muscle fatigue. We discussed the ergonomics patient is still working on getting a standing desk. We discussed icing regimen. Discuss the topical anti-inflammatories as needed. Encourage patient to continue to monitor her diet to make sure she is getting plenty of protein. She'll given some other strengthening and stabilization exercises today. Patient come back and see me again in 5-6 weeks.

## 2015-10-11 NOTE — Assessment & Plan Note (Signed)
Decision today to treat with OMT was based on Physical Exam  After verbal consent patient was treated with HVLA, ME techniques in cervical, thoracic and rib areas  Patient tolerated the procedure well with improvement in symptoms  Patient given exercises, stretches and lifestyle modifications  See medications in patient instructions if given  Patient will follow up in 4-6 weeks                                           

## 2015-10-11 NOTE — Patient Instructions (Signed)
Verbal instructions given

## 2015-11-15 ENCOUNTER — Ambulatory Visit: Payer: BLUE CROSS/BLUE SHIELD | Admitting: Family Medicine

## 2015-11-21 ENCOUNTER — Ambulatory Visit (INDEPENDENT_AMBULATORY_CARE_PROVIDER_SITE_OTHER): Payer: BLUE CROSS/BLUE SHIELD | Admitting: Family Medicine

## 2015-11-21 ENCOUNTER — Encounter: Payer: Self-pay | Admitting: Family Medicine

## 2015-11-21 VITALS — BP 122/80 | HR 74 | Wt 141.0 lb

## 2015-11-21 DIAGNOSIS — M9908 Segmental and somatic dysfunction of rib cage: Secondary | ICD-10-CM

## 2015-11-21 DIAGNOSIS — F4323 Adjustment disorder with mixed anxiety and depressed mood: Secondary | ICD-10-CM | POA: Diagnosis not present

## 2015-11-21 DIAGNOSIS — M999 Biomechanical lesion, unspecified: Secondary | ICD-10-CM

## 2015-11-21 DIAGNOSIS — M940 Chondrocostal junction syndrome [Tietze]: Secondary | ICD-10-CM

## 2015-11-21 DIAGNOSIS — M9902 Segmental and somatic dysfunction of thoracic region: Secondary | ICD-10-CM

## 2015-11-21 DIAGNOSIS — M9901 Segmental and somatic dysfunction of cervical region: Secondary | ICD-10-CM | POA: Diagnosis not present

## 2015-11-21 MED ORDER — VENLAFAXINE HCL ER 37.5 MG PO CP24: 37.5000 mg | ORAL_CAPSULE | Freq: Every day | ORAL | Status: DC

## 2015-11-21 MED ORDER — HYDROXYZINE HCL 10 MG PO TABS: 10.0000 mg | ORAL_TABLET | Freq: Three times a day (TID) | ORAL | Status: DC | PRN
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
## 2015-11-21 NOTE — Assessment & Plan Note (Signed)
I do believe the patient does have a generalized anxiety disorder and some adjustment disorder. We discussed with patient about different treatment options. Has elected to try the Effexor as well as the hydroxyzine. Discussed potential side effects. We discussed icing regimen. Patient will come back and see me again in 2-3 weeks for further evaluation. We'll call if any significant side effects to the medications.

## 2015-11-21 NOTE — Assessment & Plan Note (Signed)
Decision today to treat with OMT was based on Physical Exam  After verbal consent patient was treated with HVLA, ME techniques in cervical, thoracic and rib areas  Patient tolerated the procedure well with improvement in symptoms  Patient given exercises, stretches and lifestyle modifications  See medications in patient instructions if given  Patient will follow up in 2-3 weeks  

## 2015-11-21 NOTE — Assessment & Plan Note (Signed)
Patient is still awaiting a standing desk at work that I think will be beneficial. Encourage her to continue to work on the posture. I do feel that this is multifactorial and patient's anxiety is also contribute in. Patient has medications for breakthrough pain. Patient will be seen me again in 2-3 weeks for further evaluation and treatment.

## 2015-11-21 NOTE — Patient Instructions (Signed)
Good to see you  Effexor daily  Hydroxyzine up to 3 times a day when you feel overwhelmed.  May make yuou sleepy Continue to work on posture See me again in 2-3 weeks  Looking forward to meeting your son

## 2015-12-05 ENCOUNTER — Ambulatory Visit (INDEPENDENT_AMBULATORY_CARE_PROVIDER_SITE_OTHER): Payer: BLUE CROSS/BLUE SHIELD | Admitting: Family Medicine

## 2015-12-05 ENCOUNTER — Ambulatory Visit: Payer: BLUE CROSS/BLUE SHIELD | Admitting: Family Medicine

## 2015-12-05 ENCOUNTER — Encounter: Payer: Self-pay | Admitting: Family Medicine

## 2015-12-05 VITALS — BP 116/80 | HR 74 | Ht 62.0 in | Wt 138.0 lb

## 2015-12-05 DIAGNOSIS — M940 Chondrocostal junction syndrome [Tietze]: Secondary | ICD-10-CM

## 2015-12-05 DIAGNOSIS — M9901 Segmental and somatic dysfunction of cervical region: Secondary | ICD-10-CM

## 2015-12-05 DIAGNOSIS — M9908 Segmental and somatic dysfunction of rib cage: Secondary | ICD-10-CM | POA: Diagnosis not present

## 2015-12-05 DIAGNOSIS — F4323 Adjustment disorder with mixed anxiety and depressed mood: Secondary | ICD-10-CM

## 2015-12-05 DIAGNOSIS — M9902 Segmental and somatic dysfunction of thoracic region: Secondary | ICD-10-CM

## 2015-12-05 DIAGNOSIS — M999 Biomechanical lesion, unspecified: Secondary | ICD-10-CM

## 2015-12-05 NOTE — Patient Instructions (Signed)
Good to see you  Ice is your friend You decide on meds See me within 4 weeks

## 2015-12-05 NOTE — Progress Notes (Signed)
Pre visit review using our clinic review tool, if applicable. No additional management support is needed unless otherwise documented below in the visit note. 

## 2015-12-05 NOTE — Assessment & Plan Note (Signed)
Decision today to treat with OMT was based on Physical Exam  After verbal consent patient was treated with HVLA, ME techniques in cervical, thoracic and rib areas  Patient tolerated the procedure well with improvement in symptoms  Patient given exercises, stretches and lifestyle modifications  See medications in patient instructions if given  Patient will follow up in 3-4 weeks      
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
## 2015-12-05 NOTE — Assessment & Plan Note (Signed)
Encourage patient to do the exercises regularly. Decline formal physical therapy again. We discussed icing regimen. Patient is going to continue to try to work on ergonomics and will be getting a standing desk in the next 2 weeks at work that I think will be helpful. Follow-up with me again in 4 weeks.

## 2015-12-05 NOTE — Assessment & Plan Note (Signed)
Patient did not take the medications as prescribed. Denies any suicidal or homicidal ideation. We discussed possible risks for all to psychology. Patient declined.

## 2016-01-04 ENCOUNTER — Encounter: Payer: Self-pay | Admitting: Family Medicine

## 2016-01-04 ENCOUNTER — Ambulatory Visit (INDEPENDENT_AMBULATORY_CARE_PROVIDER_SITE_OTHER): Payer: BLUE CROSS/BLUE SHIELD | Admitting: Family Medicine

## 2016-01-04 VITALS — BP 102/82 | HR 70 | Ht 62.0 in | Wt 138.0 lb

## 2016-01-04 DIAGNOSIS — M9908 Segmental and somatic dysfunction of rib cage: Secondary | ICD-10-CM

## 2016-01-04 DIAGNOSIS — M940 Chondrocostal junction syndrome [Tietze]: Secondary | ICD-10-CM | POA: Diagnosis not present

## 2016-01-04 DIAGNOSIS — M9901 Segmental and somatic dysfunction of cervical region: Secondary | ICD-10-CM | POA: Diagnosis not present

## 2016-01-04 DIAGNOSIS — M999 Biomechanical lesion, unspecified: Secondary | ICD-10-CM

## 2016-01-04 DIAGNOSIS — M9902 Segmental and somatic dysfunction of thoracic region: Secondary | ICD-10-CM

## 2016-01-04 NOTE — Assessment & Plan Note (Signed)
Continues to give her some discomfort. We discussed icing regimen and home exercises. We discussed which are decreased doing which ones to avoid. Encourage her to continue the vitamin supplementation. Patient given topical anti-inflammatories trial size again and does have a pressure should home. Encourage her to consider the Effexor that I think will be beneficial for some anxiety but she seems to be doing better. Patient come back and see me again in 4 weeks.

## 2016-01-04 NOTE — Patient Instructions (Signed)
Good to see you  Ice is your friend  pennsaid pinkie amount topically 2 times daily as needed.  I can't wait for your standing desk See me again in 4 weeks.
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
## 2016-01-04 NOTE — Progress Notes (Signed)
Pre visit review using our clinic review tool, if applicable. No additional management support is needed unless otherwise documented below in the visit note. 

## 2016-01-04 NOTE — Assessment & Plan Note (Signed)
Decision today to treat with OMT was based on Physical Exam  After verbal consent patient was treated with HVLA, ME techniques in cervical, thoracic and rib areas  Patient tolerated the procedure well with improvement in symptoms  Patient given exercises, stretches and lifestyle modifications  See medications in patient instructions if given  Patient will follow up in 4 weeks    

## 2016-02-01 ENCOUNTER — Ambulatory Visit (INDEPENDENT_AMBULATORY_CARE_PROVIDER_SITE_OTHER): Payer: BLUE CROSS/BLUE SHIELD | Admitting: Family Medicine

## 2016-02-01 ENCOUNTER — Encounter: Payer: Self-pay | Admitting: Family Medicine

## 2016-02-01 VITALS — BP 104/72 | HR 81 | Ht 62.0 in | Wt 136.0 lb

## 2016-02-01 DIAGNOSIS — M9908 Segmental and somatic dysfunction of rib cage: Secondary | ICD-10-CM | POA: Diagnosis not present

## 2016-02-01 DIAGNOSIS — M940 Chondrocostal junction syndrome [Tietze]: Secondary | ICD-10-CM

## 2016-02-01 DIAGNOSIS — M9901 Segmental and somatic dysfunction of cervical region: Secondary | ICD-10-CM

## 2016-02-01 DIAGNOSIS — F4323 Adjustment disorder with mixed anxiety and depressed mood: Secondary | ICD-10-CM

## 2016-02-01 DIAGNOSIS — M9902 Segmental and somatic dysfunction of thoracic region: Secondary | ICD-10-CM | POA: Diagnosis not present

## 2016-02-01 DIAGNOSIS — M999 Biomechanical lesion, unspecified: Secondary | ICD-10-CM

## 2016-02-01 NOTE — Progress Notes (Signed)
Pre visit review using our clinic review tool, if applicable. No additional management support is needed unless otherwise documented below in the visit note. 

## 2016-02-01 NOTE — Patient Instructions (Signed)
Good to see you  Posture posture posture.  I think the standing desk will help Tennis ball between shoulder blades will help I am so happy for you!  Making progress See me again in 5-6 weeks.

## 2016-02-01 NOTE — Assessment & Plan Note (Signed)
Patient is doing much better with the low dose of Effexor. Do not feel that we need to increase dose. We'll continue to monitor and follow up again in 6 weeks. We'll refill as needed.

## 2016-02-01 NOTE — Assessment & Plan Note (Signed)
Improving at this time. I do believe with the ergonomic changes at work she'll make significant improvement. We discussed continuing to monitor. Continuing to do the muscle strengthening as of the range of motion exercises. Patient will attempt to wear better shoes. Patient come back and see me again in 6 weeks for further evaluation.
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
## 2016-02-01 NOTE — Assessment & Plan Note (Signed)
Decision today to treat with OMT was based on Physical Exam  After verbal consent patient was treated with HVLA, ME techniques in cervical, thoracic and rib areas  Patient tolerated the procedure well with improvement in symptoms  Patient given exercises, stretches and lifestyle modifications  See medications in patient instructions if given  Patient will follow up in 5-6 weeks
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
## 2016-03-01 ENCOUNTER — Ambulatory Visit (INDEPENDENT_AMBULATORY_CARE_PROVIDER_SITE_OTHER): Payer: BLUE CROSS/BLUE SHIELD | Admitting: Family Medicine

## 2016-03-01 ENCOUNTER — Encounter: Payer: Self-pay | Admitting: Family Medicine

## 2016-03-01 VITALS — BP 124/78 | HR 80

## 2016-03-01 DIAGNOSIS — M940 Chondrocostal junction syndrome [Tietze]: Secondary | ICD-10-CM

## 2016-03-01 DIAGNOSIS — M999 Biomechanical lesion, unspecified: Secondary | ICD-10-CM

## 2016-03-01 DIAGNOSIS — M9901 Segmental and somatic dysfunction of cervical region: Secondary | ICD-10-CM | POA: Diagnosis not present

## 2016-03-01 DIAGNOSIS — M9908 Segmental and somatic dysfunction of rib cage: Secondary | ICD-10-CM

## 2016-03-01 DIAGNOSIS — M9902 Segmental and somatic dysfunction of thoracic region: Secondary | ICD-10-CM

## 2016-03-01 NOTE — Patient Instructions (Signed)
Good to see you  Ice is your friend Have a great time See me again in 5-6 weeks

## 2016-03-01 NOTE — Assessment & Plan Note (Signed)
Doing well.  Range of motion discussed and continue ergonomics.  Discussed posture.  RTC in 5-6 weeks.

## 2016-03-01 NOTE — Assessment & Plan Note (Signed)
Decision today to treat with OMT was based on Physical Exam  After verbal consent patient was treated with HVLA, ME techniques in cervical, thoracic and rib areas  Patient tolerated the procedure well with improvement in symptoms  Patient given exercises, stretches and lifestyle modifications  See medications in patient instructions if given  Patient will follow up in 5-6 weeks

## 2016-04-10 ENCOUNTER — Encounter: Payer: Self-pay | Admitting: Family Medicine

## 2016-04-10 ENCOUNTER — Ambulatory Visit (INDEPENDENT_AMBULATORY_CARE_PROVIDER_SITE_OTHER): Payer: BLUE CROSS/BLUE SHIELD | Admitting: Family Medicine

## 2016-04-10 VITALS — BP 102/78 | HR 82 | Wt 139.0 lb

## 2016-04-10 DIAGNOSIS — M9908 Segmental and somatic dysfunction of rib cage: Secondary | ICD-10-CM

## 2016-04-10 DIAGNOSIS — M9901 Segmental and somatic dysfunction of cervical region: Secondary | ICD-10-CM | POA: Diagnosis not present

## 2016-04-10 DIAGNOSIS — M9902 Segmental and somatic dysfunction of thoracic region: Secondary | ICD-10-CM

## 2016-04-10 DIAGNOSIS — M940 Chondrocostal junction syndrome [Tietze]: Secondary | ICD-10-CM | POA: Diagnosis not present

## 2016-04-10 DIAGNOSIS — M999 Biomechanical lesion, unspecified: Secondary | ICD-10-CM
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
## 2016-04-10 NOTE — Assessment & Plan Note (Signed)
Decision today to treat with OMT was based on Physical Exam  After verbal consent patient was treated with HVLA, ME techniques in cervical, thoracic and rib areas  Patient tolerated the procedure well with improvement in symptoms  Patient given exercises, stretches and lifestyle modifications  See medications in patient instructions if given  Patient will follow up in 4-8 weeks        

## 2016-04-10 NOTE — Assessment & Plan Note (Signed)
Patient encouraged to continue to work on core strength, we discussed the importance of muscle strengthening for stability with patient's benign hypermobility syndrome. Continues to respond well to osteopathic manipulation. Continue same medications. Follow-up again in 4-8 weeks.

## 2016-05-15 ENCOUNTER — Ambulatory Visit: Payer: BLUE CROSS/BLUE SHIELD | Admitting: Family Medicine
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
## 2016-05-28 ENCOUNTER — Ambulatory Visit (INDEPENDENT_AMBULATORY_CARE_PROVIDER_SITE_OTHER): Payer: BLUE CROSS/BLUE SHIELD | Admitting: Family Medicine

## 2016-05-28 ENCOUNTER — Encounter: Payer: Self-pay | Admitting: Family Medicine

## 2016-05-28 VITALS — BP 110/80 | HR 85 | Ht 62.0 in | Wt 135.0 lb

## 2016-05-28 DIAGNOSIS — M940 Chondrocostal junction syndrome [Tietze]: Secondary | ICD-10-CM | POA: Diagnosis not present

## 2016-05-28 DIAGNOSIS — M999 Biomechanical lesion, unspecified: Secondary | ICD-10-CM | POA: Diagnosis not present

## 2016-05-28 NOTE — Patient Instructions (Addendum)
Great to see you  Happy halloween I am so impressed  Keep it up  We will see you again in 2 months!

## 2016-05-28 NOTE — Assessment & Plan Note (Signed)
Very impressed  reiterated the importance of conservative therapy. We discussed that posture and ergonomics. Patient does well we'll do 2 month intervals.

## 2016-05-28 NOTE — Assessment & Plan Note (Signed)
Decision today to treat with OMT was based on Physical Exam  After verbal consent patient was treated with HVLA, ME techniques in cervical, thoracic and rib areas  Patient tolerated the procedure well with improvement in symptoms  Patient given exercises, stretches and lifestyle modifications  See medications in patient instructions if given  Patient will follow up in 6-8 weeks
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
## 2016-07-24 ENCOUNTER — Encounter: Payer: Self-pay | Admitting: Family Medicine

## 2016-07-24 ENCOUNTER — Ambulatory Visit (INDEPENDENT_AMBULATORY_CARE_PROVIDER_SITE_OTHER): Payer: BLUE CROSS/BLUE SHIELD | Admitting: Family Medicine

## 2016-07-24 VITALS — BP 122/80 | HR 65 | Ht 62.0 in | Wt 134.0 lb

## 2016-07-24 DIAGNOSIS — M940 Chondrocostal junction syndrome [Tietze]: Secondary | ICD-10-CM | POA: Diagnosis not present

## 2016-07-24 DIAGNOSIS — M533 Sacrococcygeal disorders, not elsewhere classified: Secondary | ICD-10-CM | POA: Diagnosis not present

## 2016-07-24 DIAGNOSIS — M999 Biomechanical lesion, unspecified: Secondary | ICD-10-CM

## 2016-07-24 NOTE — Assessment & Plan Note (Signed)
Sacroiliac Joint Mobilization and Rehab 1. Work on pretzel stretching, shoulder back and leg draped in front. 3-5 sets, 30 sec.. 2. hip abductor rotations. standing, hip flexion and rotation outward then inward. 3 sets, 15 reps. when can do comfortably, add ankle weights starting at 2 pounds.  3. cross over stretching - shoulder back to ground, same side leg crossover. 3-5 sets for 30 min..  4. rolling up and back knees to chest and rocking. 5. sacral tilt - 5 sets, hold for 5-10 seconds  

## 2016-07-24 NOTE — Patient Instructions (Signed)
Good to see you  Ice is your friend Sorry for the little bit of torture Lets say 3-4 weeks.

## 2016-07-24 NOTE — Assessment & Plan Note (Signed)
Decision today to treat with OMT was based on Physical Exam  After verbal consent patient was treated with HVLA, ME techniques in cervical, thoracic and rib areas  Patient tolerated the procedure well with improvement in symptoms  Patient given exercises, stretches and lifestyle modifications  See medications in patient instructions if given  Patient will follow up in 4 weeks    

## 2016-07-24 NOTE — Assessment & Plan Note (Signed)
Seems stable at this time. More discomfort over the sacroiliac joint today. We discussed home exercises and icing protocol. Discussed the importance of core strengthening. Responded well to osteopathic manipulation will see her again in 4 weeks.

## 2016-08-14 ENCOUNTER — Ambulatory Visit: Payer: BLUE CROSS/BLUE SHIELD | Admitting: Family Medicine
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
## 2016-08-21 ENCOUNTER — Other Ambulatory Visit (INDEPENDENT_AMBULATORY_CARE_PROVIDER_SITE_OTHER): Payer: BLUE CROSS/BLUE SHIELD

## 2016-08-21 ENCOUNTER — Ambulatory Visit (INDEPENDENT_AMBULATORY_CARE_PROVIDER_SITE_OTHER): Payer: BLUE CROSS/BLUE SHIELD | Admitting: Family Medicine

## 2016-08-21 ENCOUNTER — Encounter: Payer: Self-pay | Admitting: Family Medicine

## 2016-08-21 VITALS — BP 116/82 | HR 80 | Ht 62.0 in | Wt 136.0 lb

## 2016-08-21 DIAGNOSIS — M2550 Pain in unspecified joint: Secondary | ICD-10-CM

## 2016-08-21 DIAGNOSIS — M940 Chondrocostal junction syndrome [Tietze]: Secondary | ICD-10-CM

## 2016-08-21 DIAGNOSIS — M533 Sacrococcygeal disorders, not elsewhere classified: Secondary | ICD-10-CM

## 2016-08-21 DIAGNOSIS — M999 Biomechanical lesion, unspecified: Secondary | ICD-10-CM

## 2016-08-21 LAB — CBC WITH DIFFERENTIAL/PLATELET
Basophils Absolute: 0 10*3/uL (ref 0.0–0.1)
Basophils Relative: 0.3 % (ref 0.0–3.0)
EOS ABS: 0.1 10*3/uL (ref 0.0–0.7)
Eosinophils Relative: 1 % (ref 0.0–5.0)
HEMATOCRIT: 37 % (ref 36.0–46.0)
HEMOGLOBIN: 12.9 g/dL (ref 12.0–15.0)
Lymphocytes Relative: 29.3 % (ref 12.0–46.0)
Lymphs Abs: 2.7 10*3/uL (ref 0.7–4.0)
MCHC: 34.8 g/dL (ref 30.0–36.0)
MCV: 85.3 fl (ref 78.0–100.0)
MONOS PCT: 5.6 % (ref 3.0–12.0)
Monocytes Absolute: 0.5 10*3/uL (ref 0.1–1.0)
NEUTROS ABS: 5.9 10*3/uL (ref 1.4–7.7)
Neutrophils Relative %: 63.8 % (ref 43.0–77.0)
PLATELETS: 324 10*3/uL (ref 150.0–400.0)
RBC: 4.34 Mil/uL (ref 3.87–5.11)
RDW: 14.3 % (ref 11.5–15.5)
WBC: 9.2 10*3/uL (ref 4.0–10.5)

## 2016-08-21 LAB — T3, FREE: T3, Free: 3 pg/mL (ref 2.3–4.2)

## 2016-08-21 LAB — T4, FREE: FREE T4: 0.76 ng/dL (ref 0.60–1.60)

## 2016-08-21 LAB — VITAMIN D 25 HYDROXY (VIT D DEFICIENCY, FRACTURES): VITD: 33.03 ng/mL (ref 30.00–100.00)

## 2016-08-21 LAB — IBC PANEL
IRON: 67 ug/dL (ref 42–145)
SATURATION RATIOS: 14.5 % — AB (ref 20.0–50.0)
TRANSFERRIN: 329 mg/dL (ref 212.0–360.0)

## 2016-08-21 LAB — TSH: TSH: 0.73 u[IU]/mL (ref 0.35–4.50)

## 2016-08-21 LAB — SEDIMENTATION RATE: Sed Rate: 11 mm/hr (ref 0–30)

## 2016-08-21 NOTE — Patient Instructions (Addendum)
Great to see you  No real changes I am impressed overall  We will get labs See me again in 4-6 weeks!

## 2016-08-21 NOTE — Assessment & Plan Note (Signed)
Still having discomfort. Patient continued have pain overall. We will get labs to make sure that there is no underlying condition I can be contributing to patient not improving. Please see orders. Patient encouraged to do the exercises more regular basis. Still responding well to osteopathic manipulation. We discussed icing regimen. Follow-up again in 4-6 weeks.

## 2016-08-21 NOTE — Assessment & Plan Note (Signed)
Decision today to treat with OMT was based on Physical Exam  After verbal consent patient was treated with HVLA, ME, FPR techniques in cervical, thoracic, lumbar and sacral areas  Patient tolerated the procedure well with improvement in symptoms  Patient given exercises, stretches and lifestyle modifications  See medications in patient instructions if given  Patient will follow up in 3-4 weeks  

## 2016-08-21 NOTE — Assessment & Plan Note (Signed)
Sacroiliac Joint Mobilization and Rehab 1. Work on pretzel stretching, shoulder back and leg draped in front. 3-5 sets, 30 sec.. 2. hip abductor rotations. standing, hip flexion and rotation outward then inward. 3 sets, 15 reps. when can do comfortably, add ankle weights starting at 2 pounds.  3. cross over stretching - shoulder back to ground, same side leg crossover. 3-5 sets for 30 min..  4. rolling up and back knees to chest and rocking. 5. sacral tilt - 5 sets, hold for 5-10 seconds  

## 2016-08-22 LAB — ANA: ANA: NEGATIVE

## 2016-09-25 ENCOUNTER — Encounter: Payer: Self-pay | Admitting: Family Medicine

## 2016-09-25 ENCOUNTER — Ambulatory Visit (INDEPENDENT_AMBULATORY_CARE_PROVIDER_SITE_OTHER): Payer: BLUE CROSS/BLUE SHIELD | Admitting: Family Medicine

## 2016-09-25 VITALS — BP 126/80 | HR 68 | Ht 62.0 in | Wt 138.0 lb

## 2016-09-25 DIAGNOSIS — M999 Biomechanical lesion, unspecified: Secondary | ICD-10-CM | POA: Diagnosis not present

## 2016-09-25 DIAGNOSIS — M940 Chondrocostal junction syndrome [Tietze]: Secondary | ICD-10-CM | POA: Diagnosis not present

## 2016-09-25 NOTE — Patient Instructions (Signed)
I am so impressed. Keep it up  Keep working on the posture.  Lets say 6-8 weeks!

## 2016-09-25 NOTE — Progress Notes (Signed)
[facility] [address] [place], [place] [address] Phone: [phone] Subjective:    CC: Upper back pain follow up  [id number]  Kristina Sanchez is a 51 y.o. female coming in with complaint of upper back pain. 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 Had been responding well to manipulation. We got a new mattress. Feels like she is making improvement. Not having as much pain. Happy with the results at this moment.    Past medical, surgical, social and family history all reviewed as of 09/25/16.  No pertanent information unless stated regarding to the chief complaint.  Past Medical History:  Diagnosis Date  . ANEMIA-IRON DEFICIENCY 08/24/2009   Qualifier: Diagnosis of  By: Caryl Never MD, Bruce    . BACK PAIN, UPPER 10/14/2009   Qualifier: Diagnosis of  By: Caryl Never MD, Bruce    . DEPRESSION 08/24/2009   Qualifier: Diagnosis of  By: Caryl Never MD, Bruce     No past surgical history on file. Social History   Social History  . Marital status: Married    Spouse name: N/A  . Number of children: N/A  . Years of education: N/A   Social History Main Topics  . Smoking status: Never Smoker  . Smokeless tobacco: Never Used  . Alcohol use None  . Drug use: Unknown  . Sexual activity: Not Asked   Other Topics Concern  . None   Social History Narrative  . None   Allergies  Allergen Reactions  . Penicillins     REACTION: hives   Family History  Problem Relation Age of Onset  . Alcohol abuse Other     grandfather  . Arthritis Other     grandmother  . Stroke Other     grandfather, grandfather    Review of Systems: No headache, visual changes, nausea, vomiting, diarrhea, constipation, dizziness, abdominal pain, skin rash, fevers, chills, night sweats, weight loss, swollen lymph nodes, body aches, joint swelling, muscle aches, chest pain, shortness of breath, mood changes.       Objective    Blood pressure 126/80, pulse 68, height 5\' 2"  (1.575 m), weight 138 lb (62.6 kg), SpO2 99 %.   Systems examined below as of 09/25/16 General: NAD A&O x3 mood, affect normal  HEENT: Pupils equal, extraocular movements intact no nystagmus Respiratory: not short of breath at rest or with speaking Cardiovascular: No lower extremity edema, non tender Skin: Warm dry intact with no signs of infection or rash on extremities or on axial skeleton. Abdomen: Soft nontender, no masses Neuro: Cranial nerves  intact, neurovascularly intact in all extremities with 2+ DTRs and 2+ pulses. Lymph: No lymphadenopathy appreciated today  Gait normal with good balance and coordination.  MSK: Non tender with full range of motion and good stability and symmetric strength and tone of shoulders, elbows, wrist,  knee hips and ankles bilaterally.    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 Beighton score of 6 Back Exam:  Inspection: unremarkable Motion: Flexion 45deg, Extension 25 deg, Side Bending to 45 deg bilaterally,  Rotation to 45 deg bilaterally improved range of motion SLR laying: Negative  XSLR laying: Negative  Palpable tenderness: Minimal tenderness over the thoracolumbar juncture as well as the lumbosacral juncture bilaterally FABER: Negative which is an improvement Sensory change: Gross sensation intact to all lumbar and sacral dermatomes.  Reflexes: 2+ at both patellar tendons, 2+ at achilles tendons, Babinski's downgoing.  Strength at  foot  Strength is 4+ out of 5 but symmetric but stable from previous exam  Neck: Inspection unremarkable. No palpable stepoffs. Negative Spurling's maneuver. Full neck range of motion Grip strength and sensation normal in bilateral hands Strength good C4 to T1 distribution No sensory change to C4 to T1 Negative Hoffman sign bilaterally Reflexes normal  Osteopathic findings Cervical C2 flexed rotated and side bent right C4 flexed rotated and side  bent left C6 flexed rotated and side bent left T3 extended rotated and side bent right inhaled third rib T9 extended rotated and side bent left L2 flexed rotated and side bent right Sacrum right on right      Impression and Recommendations:     This case required medical decision making of moderate complexity.

## 2016-09-25 NOTE — Assessment & Plan Note (Signed)
Decision today to treat with OMT was based on Physical Exam  After verbal consent patient was treated with HVLA, ME, FPR techniques in cervical, thoracic, rib lumbar and sacral areas  Patient tolerated the procedure well with improvement in symptoms  Patient given exercises, stretches and lifestyle modifications  See medications in patient instructions if given  Patient will follow up in 8-12 weeks 

## 2016-09-25 NOTE — Assessment & Plan Note (Signed)
Overall improvement. Patient doing the exercises on a more regular basis. We discussed continuing the vitamins. Discussed ergonomics and the importance of continuing to stay active. Responds well to manipulation. Patient will come back and see me again in 8-12 weeks.

## 2016-11-06 ENCOUNTER — Ambulatory Visit (INDEPENDENT_AMBULATORY_CARE_PROVIDER_SITE_OTHER): Payer: BLUE CROSS/BLUE SHIELD | Admitting: Family Medicine

## 2016-11-06 ENCOUNTER — Encounter: Payer: Self-pay | Admitting: Family Medicine

## 2016-11-06 VITALS — BP 118/84 | HR 75 | Resp 16 | Wt 137.5 lb

## 2016-11-06 DIAGNOSIS — M999 Biomechanical lesion, unspecified: Secondary | ICD-10-CM

## 2016-11-06 DIAGNOSIS — M940 Chondrocostal junction syndrome [Tietze]: Secondary | ICD-10-CM

## 2016-11-06 NOTE — Progress Notes (Signed)
[facility] [address] [place], [place] [address] Phone: [phone] Subjective:    CC: Upper back pain follow up  [id number]  Kristina Sanchez is a 51 y.o. female coming in with complaint of upper back pain. 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 Patient states that this is causing her to have more pain. Not doing the exercises much. Not working on a regular basis.  Past medical, surgical, social and family history all reviewed as of 11/06/16.  No pertanent information unless stated regarding to the chief complaint.  Past Medical History:  Diagnosis Date  . ANEMIA-IRON DEFICIENCY 08/24/2009   Qualifier: Diagnosis of  By: Caryl Never MD, Bruce    . BACK PAIN, UPPER 10/14/2009   Qualifier: Diagnosis of  By: Caryl Never MD, Bruce    . DEPRESSION 08/24/2009   Qualifier: Diagnosis of  By: Caryl Never MD, Bruce     History reviewed. No pertinent surgical history. Social History   Social History  . Marital status: Married    Spouse name: N/A  . Number of children: N/A  . Years of education: N/A   Social History Main Topics  . Smoking status: Never Smoker  . Smokeless tobacco: Never Used  . Alcohol use None  . Drug use: Unknown  . Sexual activity: Not Asked   Other Topics Concern  . None   Social History Narrative  . None   Allergies  Allergen Reactions  . Penicillins     REACTION: hives   Family History  Problem Relation Age of Onset  . Alcohol abuse Other     grandfather  . Arthritis Other     grandmother  . Stroke Other     grandfather, grandfather    Review of Systems: No headache, visual changes, nausea, vomiting, diarrhea, constipation, dizziness, abdominal pain, skin rash, fevers, chills, night sweats, weight loss, swollen lymph nodes, body aches, joint swelling, muscle aches, chest pain, shortness of breath, mood changes.      Objective  Blood pressure 118/84, pulse 75, resp. rate 16,  weight 137 lb 8 oz (62.4 kg), SpO2 98 %.   Systems examined below as of 11/06/16 General: NAD A&O x3 mood, affect normal  HEENT: Pupils equal, extraocular movements intact no nystagmus Respiratory: not short of breath at rest or with speaking Cardiovascular: No lower extremity edema, non tender Skin: Warm dry intact with no signs of infection or rash on extremities or on axial skeleton. Abdomen: Soft nontender, no masses Neuro: Cranial nerves  intact, neurovascularly intact in all extremities with 2+ DTRs and 2+ pulses. Lymph: No lymphadenopathy appreciated today  Gait normal with good balance and coordination.  MSK: Non tender with full range of motion and good stability and symmetric strength and tone of shoulders, elbows, wrist,  knee hips and ankles bilaterally.  .    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 Beighton score of 6 Back Exam:  Inspection: unremarkable Motion: Flexion 45deg, Extension 25 deg, Side Bending to 45 deg bilaterally,  Rotation to 45 deg bilaterally improved range of motion SLR laying: Negative  XSLR laying: Negative  Palpable tenderness: More tenderness in the thoracolumbar junction at the lumbosacral junction and previous exam. Some loss of neck lordosis FABER: Negative which is an improvement Sensory change: Gross sensation intact to all lumbar and sacral dermatomes.  Reflexes: 2+ at both patellar tendons, 2+ at achilles tendons, Babinski's downgoing.  Strength at foot  Strength is 4+  out of 5 but symmetric but stable from previous exam  Neck: Inspection unremarkable. No palpable stepoffs. Negative Spurling's maneuver. Mild limitation in range of motion lacking the last 5 of rotation bilaterally Grip strength and sensation normal in bilateral hands Strength good C4 to T1 distribution No sensory change to C4 to T1 Negative Hoffman sign bilaterally Reflexes normal  Osteopathic findings Cervical C2 flexed rotated and side bent right C7  flexed rotated and side bent left T3 extended rotated and side bent left inhaled third rib T5 extended rotated and side bent left L2 flexed rotated and side bent right Sacrum right on right       Impression and Recommendations:     This case required medical decision making of moderate complexity.

## 2016-11-06 NOTE — Patient Instructions (Signed)
Good to see you  Good luck with the wine See me again in 3-4 weeks.

## 2016-11-06 NOTE — Assessment & Plan Note (Signed)
Patient had more slipping of the rib on the contralateral side. I do think that this is contribute in. Patient wants to avoid any type of medication at this time. Patient is having more stress and anxiety but does not want to take any medications. We discussed with patient about core strengthening. Patient will continue with conservative therapy at this time. Like to see her again though in the next 3-4 weeks for further evaluation and treatment.

## 2016-11-06 NOTE — Progress Notes (Signed)
Pre-visit discussion using our clinic review tool. No additional management support is needed unless otherwise documented below in the visit note.  

## 2016-11-06 NOTE — Assessment & Plan Note (Signed)
Decision today to treat with OMT was based on Physical Exam  After verbal consent patient was treated with HVLA, ME, FPR techniques in cervical, thoracic, rib lumbar and sacral areas  Patient tolerated the procedure well with improvement in symptoms  Patient given exercises, stretches and lifestyle modifications  See medications in patient instructions if given  Patient will follow up in 3-4 weeks 

## 2016-11-28 ENCOUNTER — Encounter: Payer: Self-pay | Admitting: Family Medicine

## 2016-11-28 ENCOUNTER — Ambulatory Visit (INDEPENDENT_AMBULATORY_CARE_PROVIDER_SITE_OTHER): Payer: BLUE CROSS/BLUE SHIELD | Admitting: Family Medicine

## 2016-11-28 VITALS — BP 110/76 | HR 87 | Resp 16 | Wt 137.2 lb

## 2016-11-28 DIAGNOSIS — M940 Chondrocostal junction syndrome [Tietze]: Secondary | ICD-10-CM

## 2016-11-28 DIAGNOSIS — M999 Biomechanical lesion, unspecified: Secondary | ICD-10-CM | POA: Diagnosis not present

## 2016-11-28 NOTE — Patient Instructions (Signed)
You are doing awesome Have a drink for me See me again in 4 weeks.

## 2016-11-28 NOTE — Progress Notes (Signed)
[name] D.O. [facility] [address] [place], [place] [address] Phone: [phone] Subjective:    CC: Upper back pain follow up  FAO:[id number]:Subjective  Kristina CitronRandi K Sanchez is a 51 y.o. female coming in with complaint of upper back pain. 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 Only some mild aches and pains. Patient is not having as much stress and thinks tendons potentially helping at this time. Patient still has some discomfort is along patient does not do the exercises or work on posture throughout the day.  Past medical, surgical, social and family history all reviewed as of 11/28/16.  No pertanent information unless stated regarding to the chief complaint.  Past Medical History:  Diagnosis Date  . ANEMIA-IRON DEFICIENCY 08/24/2009   Qualifier: Diagnosis of  By: Caryl NeverBurchette MD, Bruce    . BACK PAIN, UPPER 10/14/2009   Qualifier: Diagnosis of  By: Caryl NeverBurchette MD, Bruce    . DEPRESSION 08/24/2009   Qualifier: Diagnosis of  By: Caryl NeverBurchette MD, Bruce     History reviewed. No pertinent surgical history. Social History   Social History  . Marital status: Married    Spouse name: N/A  . Number of children: N/A  . Years of education: N/A   Social History Main Topics  . Smoking status: Never Smoker  . Smokeless tobacco: Never Used  . Alcohol use None  . Drug use: Unknown  . Sexual activity: Not Asked   Other Topics Concern  . None   Social History Narrative  . None   Allergies  Allergen Reactions  . Penicillins     REACTION: hives   Family History  Problem Relation Age of Onset  . Alcohol abuse Other     grandfather  . Arthritis Other     grandmother  . Stroke Other     grandfather, grandfather    Review of Systems: No headache, visual changes, nausea, vomiting, diarrhea, constipation, dizziness, abdominal pain, skin rash, fevers, chills, night sweats, weight loss, swollen lymph nodes, body aches, joint  swelling, muscle aches, chest pain, shortness of breath, mood changes.   Objective  Blood pressure 110/76, pulse 87, resp. rate 16, weight 137 lb 4 oz (62.3 kg), SpO2 97 %.   Systems examined below as of 11/28/16 General: NAD A&O x3 mood, affect normal  HEENT: Pupils equal, extraocular movements intact no nystagmus Respiratory: not short of breath at rest or with speaking Cardiovascular: No lower extremity edema, non tender Skin: Warm dry intact with no signs of infection or rash on extremities or on axial skeleton. Abdomen: Soft nontender, no masses Neuro: Cranial nerves  intact, neurovascularly intact in all extremities with 2+ DTRs and 2+ pulses. Lymph: No lymphadenopathy appreciated today  Gait normal with good balance and coordination.  MSK: Non tender with full range of motion and good stability and symmetric strength and tone of shoulders, elbows, wrist,  knee hips and ankles bilaterally.  .    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 Beighton score of 6 Back Exam:  Inspection: unremarkable Motion: Flexion 35deg, Extension 25 deg, Side Bending to 45 deg bilaterally,  Rotation to 35 deg bilaterally improved range of motion SLR laying: Negative  XSLR laying: Negative  Palpable tenderness: More tenderness in the thoracolumbar junction at the lumbosacral junction and previous exam. Some loss of neck lordosis FABER: Negative which is an improvement Sensory change: Gross sensation intact to all lumbar and sacral dermatomes.  Reflexes:  2+ at both patellar tendons, 2+ at achilles tendons, Babinski's downgoing.  Strength at foot  5 out of 5 strength  Neck: Inspection unremarkable. No palpable stepoffs. Negative Spurling's maneuver. Still some mild tightness but near full range of motion Grip strength and sensation normal in bilateral hands Strength good C4 to T1 distribution No sensory change to C4 to T1 Negative Hoffman sign bilaterally Reflexes normal  Osteopathic  findings Cervical C2 flexed rotated and side bent right C4 flexed rotated and side bent left C6 flexed rotated and side bent left T3 extended rotated and side bent right inhaled third rib T9 extended rotated and side bent left L2 flexed rotated and side bent right Sacrum right on right        Impression and Recommendations:     This case required medical decision making of moderate complexity.

## 2016-11-28 NOTE — Assessment & Plan Note (Signed)
Decision today to treat with OMT was based on Physical Exam  After verbal consent patient was treated with HVLA, ME, FPR techniques in cervical, thoracic, rib lumbar and sacral areas  Patient tolerated the procedure well with improvement in symptoms  Patient given exercises, stretches and lifestyle modifications  See medications in patient instructions if given  Patient will follow up in 4-6 weeks 

## 2016-11-28 NOTE — Assessment & Plan Note (Signed)
Once again patient's muscle imbalances likely contributing to most of the discomfort and pain. Patient also has some stressing seems to be irritating it from time to time. Not taking any medication drilling but does have topical anti-inflammatories if needed. We discussed avoiding certain positions as well as activities. Follow-up again in 4-6 weeks.

## 2016-12-26 ENCOUNTER — Encounter: Payer: Self-pay | Admitting: Family Medicine

## 2016-12-26 ENCOUNTER — Ambulatory Visit (INDEPENDENT_AMBULATORY_CARE_PROVIDER_SITE_OTHER): Payer: BLUE CROSS/BLUE SHIELD | Admitting: Family Medicine

## 2016-12-26 VITALS — BP 122/82 | HR 68 | Ht 62.0 in | Wt 140.0 lb

## 2016-12-26 DIAGNOSIS — M940 Chondrocostal junction syndrome [Tietze]: Secondary | ICD-10-CM | POA: Diagnosis not present

## 2016-12-26 DIAGNOSIS — M999 Biomechanical lesion, unspecified: Secondary | ICD-10-CM | POA: Diagnosis not present

## 2016-12-26 NOTE — Assessment & Plan Note (Signed)
Decision today to treat with OMT was based on Physical Exam  After verbal consent patient was treated with HVLA, ME, FPR techniques in cervical, thoracic, rib, lumbar and sacral areas  Patient tolerated the procedure well with improvement in symptoms  Patient given exercises, stretches and lifestyle modifications  See medications in patient instructions if given  Patient will follow up in 6-12 weeks 

## 2016-12-26 NOTE — Progress Notes (Signed)
Tawana ScaleZach Sanchez D.O. [facility] [address] [place], [place] [address] Phone: [phone](336) [phone] Subjective:     CC: Neck and upper back pain  YQI:[id number]:Subjective  Kristina CitronRandi K Sanchez is a 51 y.o. female coming in with complaint of neck and back pain. Patient has been seen multiple times for a slipped rib syndrome. Has responded well to osteopathic manipulation. Patient has been doing relatively well. Patient states Overall has been doing relatively well. Has been working with a more intensive this is contributing to some of the discomfort. No radiation of the pain no numbness.      Past Medical History:  Diagnosis Date  . ANEMIA-IRON DEFICIENCY 08/24/2009   Qualifier: Diagnosis of  By: Caryl NeverBurchette MD, Bruce    . BACK PAIN, UPPER 10/14/2009   Qualifier: Diagnosis of  By: Caryl NeverBurchette MD, Bruce    . DEPRESSION 08/24/2009   Qualifier: Diagnosis of  By: Caryl NeverBurchette MD, Bruce     No past surgical history on file. Social History   Social History  . Marital status: Married    Spouse name: N/A  . Number of children: N/A  . Years of education: N/A   Social History Main Topics  . Smoking status: Never Smoker  . Smokeless tobacco: Never Used  . Alcohol use Not on file  . Drug use: Unknown  . Sexual activity: Not on file   Other Topics Concern  . Not on file   Social History Narrative  . No narrative on file   Allergies  Allergen Reactions  . Penicillins     REACTION: hives   Family History  Problem Relation Age of Onset  . Alcohol abuse Other        grandfather  . Arthritis Other        grandmother  . Stroke Other        grandfather, grandfather    Past medical history, social, surgical and family history all reviewed in electronic medical record.  No pertanent information unless stated regarding to the chief complaint.   Review of Systems:Review of systems updated and as accurate as of 12/26/16  No headache, visual changes, nausea, vomiting, diarrhea, constipation, dizziness,  abdominal pain, skin rash, fevers, chills, night sweats, weight loss, swollen lymph nodes, body aches, joint swelling chest pain, shortness of breath, mood changes.  Positive muscle aches  Objective  There were no vitals taken for this visit. Systems examined below as of 12/26/16   General: No apparent distress alert and oriented x3 mood and affect normal, dressed appropriately.  HEENT: Pupils equal, extraocular movements intact  Respiratory: Patient's speak in full sentences and does not appear short of breath  Cardiovascular: No lower extremity edema, non tender, no erythema  Skin: Warm dry intact with no signs of infection or rash on extremities or on axial skeleton.  Abdomen: Soft nontender  Neuro: Cranial nerves II through XII are intact, neurovascularly intact in all extremities with 2+ DTRs and 2+ pulses.  Lymph: No lymphadenopathy of posterior or anterior cervical chain or axillae bilaterally.  Gait normal with good balance and coordination.  MSK:  Non tender with full range of motion and good stability and symmetric strength and tone of shoulders, elbows, wrist, hip, knee and ankles bilaterally.  Neck: Inspection very mild loss of lordosis. No palpable stepoffs. Negative Spurling's maneuver. Lacks Last 5 of side bending bilaterally Grip strength and sensation normal in bilateral hands Strength good C4 to T1 distribution No sensory change to C4 to T1 Negative Hoffman sign  bilaterally Reflexes normal  Osteopathic findings C2 flexed rotated and side bent right C4 flexed rotated and side bent left C6 flexed rotated and side bent left T3 extended rotated and side bent right inhaled third rib T9 extended rotated and side bent left L2 flexed rotated and side bent right Sacrum right on right Obturator spasm on left     Impression and Recommendations:     This case required medical decision making of moderate complexity.      Note: This dictation was prepared with  Dragon dictation along with smaller phrase technology. Any transcriptional errors that result from this process are unintentional.

## 2016-12-26 NOTE — Patient Instructions (Addendum)
Great to see you as always.  Stay active.  Keep doing what you are doing.  Watch the chair and the sleep  See me again in 6 weeks.

## 2016-12-26 NOTE — Assessment & Plan Note (Signed)
Discussed with patient in great length. Patient is coming continue to work on Financial controllerergonomics and core stability. We discussed icing regimen and home exercises. Discussed which activities doing which was to avoid. Patient will continue to be active. Patient has responded well to osteopathic manipulation and will follow-up again in 6-12 weeks.

## 2017-01-28 ENCOUNTER — Other Ambulatory Visit: Payer: Self-pay | Admitting: Obstetrics and Gynecology

## 2017-01-28 DIAGNOSIS — R928 Other abnormal and inconclusive findings on diagnostic imaging of breast: Secondary | ICD-10-CM

## 2017-01-31 ENCOUNTER — Ambulatory Visit
Admission: RE | Admit: 2017-01-31 | Discharge: 2017-01-31 | Disposition: A | Discharge status: Home / Self Care | Payer: BLUE CROSS/BLUE SHIELD | Source: Ambulatory Visit | Attending: Obstetrics and Gynecology | Admitting: Obstetrics and Gynecology

## 2017-01-31 ENCOUNTER — Other Ambulatory Visit: Payer: Self-pay | Admitting: Obstetrics and Gynecology

## 2017-01-31 DIAGNOSIS — R928 Other abnormal and inconclusive findings on diagnostic imaging of breast: Secondary | ICD-10-CM

## 2017-01-31 DIAGNOSIS — N6310 Unspecified lump in the right breast, unspecified quadrant: Secondary | ICD-10-CM

## 2017-02-05 ENCOUNTER — Other Ambulatory Visit: Payer: Self-pay | Admitting: Obstetrics and Gynecology

## 2017-02-05 ENCOUNTER — Ambulatory Visit
Admission: RE | Admit: 2017-02-05 | Discharge: 2017-02-05 | Disposition: A | Discharge status: Home / Self Care | Payer: BLUE CROSS/BLUE SHIELD | Source: Ambulatory Visit | Attending: Obstetrics and Gynecology | Admitting: Obstetrics and Gynecology

## 2017-02-05 DIAGNOSIS — N6310 Unspecified lump in the right breast, unspecified quadrant: Secondary | ICD-10-CM

## 2017-02-05 DIAGNOSIS — N6001 Solitary cyst of right breast: Secondary | ICD-10-CM

## 2017-02-06 ENCOUNTER — Ambulatory Visit: Payer: BLUE CROSS/BLUE SHIELD | Admitting: Family Medicine

## 2017-02-12 ENCOUNTER — Encounter: Payer: Self-pay | Admitting: Family Medicine

## 2017-02-12 ENCOUNTER — Ambulatory Visit (INDEPENDENT_AMBULATORY_CARE_PROVIDER_SITE_OTHER): Payer: BLUE CROSS/BLUE SHIELD | Admitting: Family Medicine

## 2017-02-12 VITALS — BP 128/80 | HR 78 | Resp 16 | Wt 134.0 lb

## 2017-02-12 DIAGNOSIS — M999 Biomechanical lesion, unspecified: Secondary | ICD-10-CM

## 2017-02-12 DIAGNOSIS — M940 Chondrocostal junction syndrome [Tietze]: Secondary | ICD-10-CM

## 2017-02-12 NOTE — Progress Notes (Signed)
[name] [address] [place], [place] [address] Phone: [phone] Subjective:     CC: Neck and upper back pain f/u  [id number]  Kristina Sanchez is a 51 y.o. female coming in with complaint of neck and back pain.  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 Has had worsening pain on the left side. Increasing stress as well with her daughter. Thinks that this is contribute into some of the discomfort. Patient states that the pelvic pain that was noted previously seems to be still there as well. No radiation down the leg.      Past Medical History:  Diagnosis Date  . ANEMIA-IRON DEFICIENCY 08/24/2009   Qualifier: Diagnosis of  By: Caryl Never MD, Bruce    . BACK PAIN, UPPER 10/14/2009   Qualifier: Diagnosis of  By: Caryl Never MD, Bruce    . DEPRESSION 08/24/2009   Qualifier: Diagnosis of  By: Caryl Never MD, Bruce     No past surgical history on file. Social History   Social History  . Marital status: Married    Spouse name: N/A  . Number of children: N/A  . Years of education: N/A   Social History Main Topics  . Smoking status: Never Smoker  . Smokeless tobacco: Never Used  . Alcohol use Not on file  . Drug use: Unknown  . Sexual activity: Not on file   Other Topics Concern  . Not on file   Social History Narrative  . No narrative on file   Allergies  Allergen Reactions  . Penicillins     REACTION: hives   Family History  Problem Relation Age of Onset  . Alcohol abuse Other        grandfather  . Arthritis Other        grandmother  . Stroke Other        grandfather, grandfather    Past medical history, social, surgical and family history all reviewed in electronic medical record.  No pertanent information unless stated regarding to the chief complaint.   Review of Systems: No headache, visual changes, nausea, vomiting, diarrhea, constipation, dizziness, abdominal pain, skin rash, fevers, chills, night sweats, weight loss, swollen  lymph nodes, body aches, joint swelling, chest pain, shortness of breath, mood changes.  Positive muscle aches  Objective  Blood pressure 128/80, pulse 78, resp. rate 16, weight 134 lb (60.8 kg), SpO2 98 %. Systems examined below as of 02/12/17   General: No apparent distress alert and oriented x3 mood and affect normal, dressed appropriately.  HEENT: Pupils equal, extraocular movements intact  Respiratory: Patient's speak in full sentences and does not appear short of breath  Cardiovascular: No lower extremity edema, non tender, no erythema  Skin: Warm dry intact with no signs of infection or rash on extremities or on axial skeleton.  Abdomen: Soft nontender  Neuro: Cranial nerves II through XII are intact, neurovascularly intact in all extremities with 2+ DTRs and 2+ pulses.  Lymph: No lymphadenopathy of posterior or anterior cervical chain or axillae bilaterally.  Gait normal with good balance and coordination.  MSK:  Non tender with full range of motion and good stability and symmetric strength and tone of shoulders, elbows, wrist, hip, knee and ankles bilaterally.  Neck: Inspection very mild loss of lordosis. No palpable stepoffs. Negative Spurling's maneuver. Lacks Last 5 of side bending bilaterally Grip strength and sensation normal in bilateral hands Strength good C4 to T1 distribution No sensory change to C4 to T1 Negative Hoffman sign  bilaterally Reflexes normal  Osteopathic findings C2 flexed rotated and side bent right C7 flexed rotated and side bent left T3 extended rotated and side bent left inhaled third rib T9 extended rotated and side bent left L3 flexed rotated and side bent right Sacrum left on left     Impression and Recommendations:     This case required medical decision making of moderate complexity.      Note: This dictation was prepared with Dragon dictation along with smaller phrase technology. Any transcriptional errors that result from this  process are unintentional.

## 2017-02-12 NOTE — Assessment & Plan Note (Signed)
Continues to be difficult. We did discuss the possibility of breast augmentation. We discussed icing regimen and home exercises. Patient will continue be active. Encourage her to continue to work on Financial controllerergonomics. Patient follow-up with me again in 4-6 weeks.

## 2017-02-12 NOTE — Assessment & Plan Note (Signed)
Decision today to treat with OMT was based on Physical Exam  After verbal consent patient was treated with HVLA, ME, FPR techniques in cervical, thoracic, rib areas  Patient tolerated the procedure well with improvement in symptoms  Patient given exercises, stretches and lifestyle modifications  See medications in patient instructions if given  Patient will follow up in 4-6 weeks 

## 2017-02-12 NOTE — Patient Instructions (Signed)
Kristina Sanchez is your friend Keep working on your posture See me again in 5-6 weeks

## 2017-03-20 ENCOUNTER — Encounter: Payer: Self-pay | Admitting: Family Medicine

## 2017-03-20 ENCOUNTER — Ambulatory Visit (INDEPENDENT_AMBULATORY_CARE_PROVIDER_SITE_OTHER): Payer: BLUE CROSS/BLUE SHIELD | Admitting: Family Medicine

## 2017-03-20 VITALS — BP 128/82 | HR 62 | Ht 62.0 in | Wt 136.0 lb

## 2017-03-20 DIAGNOSIS — M940 Chondrocostal junction syndrome [Tietze]: Secondary | ICD-10-CM

## 2017-03-20 DIAGNOSIS — M999 Biomechanical lesion, unspecified: Secondary | ICD-10-CM

## 2017-03-20 NOTE — Progress Notes (Signed)
[name] [address] [place], [place] [address] Phone: [phone] Subjective:     CC: Neck and upper back pain f/u  [id number]  Kristina Sanchez is a 51 y.o. female coming in with complaint of neck and back pain. Patient overall seems to be doing relatively well but did do a lot of traveling. Did not do the exercises as much. Having worsening discomfort more of the lower back than usual. No radiation down the legs or any numbness or tingling.     Past Medical History:  Diagnosis Date  . ANEMIA-IRON DEFICIENCY 08/24/2009   Qualifier: Diagnosis of  By: Caryl Never MD, Bruce    . BACK PAIN, UPPER 10/14/2009   Qualifier: Diagnosis of  By: Caryl Never MD, Bruce    . DEPRESSION 08/24/2009   Qualifier: Diagnosis of  By: Caryl Never MD, Bruce     No past surgical history on file. Social History   Social History  . Marital status: Married    Spouse name: N/A  . Number of children: N/A  . Years of education: N/A   Social History Main Topics  . Smoking status: Never Smoker  . Smokeless tobacco: Never Used  . Alcohol use None  . Drug use: Unknown  . Sexual activity: Not Asked   Other Topics Concern  . None   Social History Narrative  . None   Allergies  Allergen Reactions  . Penicillins     REACTION: hives   Family History  Problem Relation Age of Onset  . Alcohol abuse Other        grandfather  . Arthritis Other        grandmother  . Stroke Other        grandfather, grandfather    Past medical history, social, surgical and family history all reviewed in electronic medical record.  No pertanent information unless stated regarding to the chief complaint.   Review of Systems: No headache, visual changes, nausea, vomiting, diarrhea, constipation, dizziness, abdominal pain, skin rash, fevers, chills, night sweats, weight loss, swollen lymph nodes, body aches, joint swelling, chest pain, shortness of breath, mood changes.  Positive muscle  aches  Objective  Blood pressure 128/82, pulse 62, height 5\' 2"  (1.575 m), weight 136 lb (61.7 kg). Systems examined below as of 03/20/17   Systems examined below as of 03/20/17 General: NAD A&O x3 mood, affect normal  HEENT: Pupils equal, extraocular movements intact no nystagmus Respiratory: not short of breath at rest or with speaking Cardiovascular: No lower extremity edema, non tender Skin: Warm dry intact with no signs of infection or rash on extremities or on axial skeleton. Abdomen: Soft nontender, no masses Neuro: Cranial nerves  intact, neurovascularly intact in all extremities with 2+ DTRs and 2+ pulses. Lymph: No lymphadenopathy appreciated today  Gait normal with good balance and coordination.  MSK: Non tender with full range of motion and good stability and symmetric strength and tone of shoulders, elbows, wrist,  knee hips and ankles bilaterally.    Neck: Inspection unremarkable. No palpable stepoffs. Negative Spurling's maneuver. Tightness more in theextension Grip strength and sensation normal in bilateral hands Strength good C4 to T1 distribution No sensory change to C4 to T1 Negative Hoffman sign bilaterally Reflexes normal Increasing tightness of the trapezius bilaterally  Lumbosacral area does have some tightness and a positive Pearlean Brownie on the right sidenegative straight leg test.   Osteopathic findings C2 flexed rotated and side bent right C5 flexed rotated and side bent  left T3 extended rotated and side bent right inhaled third rib T6 extended rotated and side bent left With inhaled rib L2 flexed rotated and side bent right Sacrum left on left      Impression and Recommendations:     This case required medical decision making of moderate complexity.      Note: This dictation was prepared with Dragon dictation along with smaller phrase technology. Any transcriptional errors that result from this process are unintentional.

## 2017-03-20 NOTE — Assessment & Plan Note (Signed)
Decision today to treat with OMT was based on Physical Exam  After verbal consent patient was treated with HVLA, ME, FPR techniques in cervical, thoracic, rib,  lumbar and sacral areas  Patient tolerated the procedure well with improvement in symptoms  Patient given exercises, stretches and lifestyle modifications  See medications in patient instructions if given  Patient will follow up in 4-8 weeks 

## 2017-03-20 NOTE — Assessment & Plan Note (Signed)
Continues to have some difficulty. Patient did have some mild increasing tightness of the lower back as well. We discussed with patient about doing exercises on a more regular basis. We discussed icing regimen and home exercises. Discussed which activities to do in which ones to avoid.Follow-up again with me in 4-8 weeks

## 2017-03-20 NOTE — Patient Instructions (Addendum)
Good to see you  Kristina Sanchez is your friend.  HAve a great tri pat the beach  See me again in 4 weeks!

## 2017-04-17 ENCOUNTER — Ambulatory Visit (INDEPENDENT_AMBULATORY_CARE_PROVIDER_SITE_OTHER): Payer: BLUE CROSS/BLUE SHIELD | Admitting: Family Medicine

## 2017-04-17 ENCOUNTER — Encounter: Payer: Self-pay | Admitting: Family Medicine

## 2017-04-17 VITALS — BP 122/84 | HR 68 | Ht 62.0 in | Wt 136.0 lb

## 2017-04-17 DIAGNOSIS — M940 Chondrocostal junction syndrome [Tietze]: Secondary | ICD-10-CM | POA: Diagnosis not present

## 2017-04-17 DIAGNOSIS — M999 Biomechanical lesion, unspecified: Secondary | ICD-10-CM | POA: Diagnosis not present

## 2017-04-17 NOTE — Assessment & Plan Note (Signed)
Discussed with patient positioning, posture, ergonomics, patient didn't seem to be doing better when she was on the Effexor and I think that there is some underlying anxiety and depression. Patient will continue to workout otherwise. Patient thinks that she was continuing with the same regimen. Follow-up again in 4-6 weeks.

## 2017-04-17 NOTE — Assessment & Plan Note (Signed)
Decision today to treat with OMT was based on Physical Exam  After verbal consent patient was treated with HVLA, ME, FPR techniques in cervical, thoracic, lumbar and sacral areas  Patient tolerated the procedure well with improvement in symptoms  Patient given exercises, stretches and lifestyle modifications  See medications in patient instructions if given  Patient will follow up in 4-6 weeks 

## 2017-04-17 NOTE — Patient Instructions (Addendum)
Great to see you as always You are doing well overall.  Do the exercises 3 times a week  On wall with heels, butt shoulder and head touching for a goal of 5 minutes daily  See me again in 4 weeks.

## 2017-04-17 NOTE — Progress Notes (Signed)
[facility] [address] [place], [place] [address] Phone: [phone] Subjective:    I'm seeing this patient by the request  of:    CC: neck, upper back and some low back pain   Kristina Sanchez  Kristina Sanchez is a 51 y.o. female coming in with complaint of low back pain. Has significant pains and degenerative posture and ergonomics. Patient continues to have increasing tightness. Patient states it seems to be more in the upper back still. Patient states that it seems to be unrelenting. Patient was doing better on the Effexor but does not want to take it on a regular basis.     Past Medical History:  Diagnosis Date  . ANEMIA-IRON DEFICIENCY 08/24/2009   Qualifier: Diagnosis of  By: Caryl Never MD, Bruce    . BACK PAIN, UPPER 10/14/2009   Qualifier: Diagnosis of  By: Caryl Never MD, Bruce    . DEPRESSION 08/24/2009   Qualifier: Diagnosis of  By: Caryl Never MD, Bruce     No past surgical history on file. Social History   Social History  . Marital status: Married    Spouse name: N/A  . Number of children: N/A  . Years of education: N/A   Social History Main Topics  . Smoking status: Never Smoker  . Smokeless tobacco: Never Used  . Alcohol use None  . Drug use: Unknown  . Sexual activity: Not Asked   Other Topics Concern  . None   Social History Narrative  . None   Allergies  Allergen Reactions  . Penicillins     REACTION: hives   Family History  Problem Relation Age of Onset  . Alcohol abuse Other        grandfather  . Arthritis Other        grandmother  . Stroke Other        grandfather, grandfather     Past medical history, social, surgical and family history all reviewed in electronic medical record.  No pertanent information unless stated regarding to the chief complaint.   Review of Systems:Review of systems updated and as accurate as of 04/17/17  No headache, visual changes, nausea, vomiting, diarrhea, constipation, dizziness,  abdominal pain, skin rash, fevers, chills, night sweats, weight loss, swollen lymph nodes, body aches, joint swelling,  chest pain, shortness of breath, mood changes. Positive muscle aches  Objective  Blood pressure 122/84, pulse 68, height  (1.575 m), weight 136 lb (61.7 kg), SpO2 96 %.   Systems examined below as of 04/17/17 General: NAD A&O x3 mood, affect normal  HEENT: Pupils equal, extraocular movements intact no nystagmus Respiratory: not short of breath at rest or with speaking Cardiovascular: No lower extremity edema, non tender Skin: Warm dry intact with no signs of infection or rash on extremities or on axial skeleton. Abdomen: Soft nontender, no masses Neuro: Cranial nerves  intact, neurovascularly intact in all extremities with 2+ DTRs and 2+ pulses. Lymph: No lymphadenopathy appreciated today  Gait normal with good balance and coordination.  MSK: Non tender with full range of motion and good stability and symmetric strength and tone of shoulders, elbows, wrist,  knee hips and ankles bilaterally.   Neck: Inspection unremarkable. No palpable stepoffs. Negative Spurling's maneuver. Full neck range of motion Grip strength and sensation normal in bilateral hands Strength good C4 to T1 distribution No sensory change to C4 to T1 Negative Hoffman sign bilaterally Reflexes normal Back Exam:  Inspection: Unremarkable  Motion: Flexion 45 deg, Extension  45 deg, Side Bending to 45 deg bilaterally,  Rotation to 45 deg bilaterally  SLR laying: Negative  XSLR laying: Negative  Palpable tenderness: None. FABER: negative. Sensory change: Gross sensation intact to all lumbar and sacral dermatomes.  Reflexes: 2+ at both patellar tendons, 2+ at achilles tendons, Babinski's downgoing.  Strength at foot  Plantar-flexion: 5/5 Dorsi-flexion: 5/5 Eversion: 5/5 Inversion: 5/5  Leg strength  Quad: 5/5 Hamstring: 5/5 Hip flexor: 5/5 Hip abductors: 5/5  Gait  unremarkable.   Osteopathic findings C2 flexed rotated and side bent right C4 flexed rotated and side bent left C6 flexed rotated and side bent left T3 extended rotated and side bent left inhaled third rib T7 extended rotated and side bent left inhaled rib L2 flexed rotated and side bent right Sacrum right on right Pelvic shear noted. Right-sided   Impression and Recommendations:     This case required medical decision making of moderate complexity.      Note: This dictation was prepared with Dragon dictation along with smaller phrase technology. Any transcriptional errors that result from this process are unintentional.

## 2017-05-15 ENCOUNTER — Encounter: Payer: Self-pay | Admitting: Family Medicine

## 2017-05-15 ENCOUNTER — Ambulatory Visit: Payer: BLUE CROSS/BLUE SHIELD | Admitting: Family Medicine

## 2017-05-15 VITALS — BP 120/70 | HR 72 | Ht 62.0 in | Wt 138.0 lb

## 2017-05-15 DIAGNOSIS — M940 Chondrocostal junction syndrome [Tietze]: Secondary | ICD-10-CM | POA: Diagnosis not present

## 2017-05-15 DIAGNOSIS — M999 Biomechanical lesion, unspecified: Secondary | ICD-10-CM | POA: Diagnosis not present

## 2017-05-15 MED ORDER — TIZANIDINE HCL 4 MG PO TABS: 4.0000 mg | ORAL_TABLET | Freq: Every evening | ORAL | 2 refills | Status: AC

## 2017-05-15 NOTE — Assessment & Plan Note (Signed)
Continues to be difficult. We discussed with patient in great length about different treatment options. Has always responded fairly well to osteopathic manipulation. I do believe that there is some underlying depression and anxiety that can contribute sometimes. Patient did do well on a entire anxiety medication before but now does not want to do it. Patient follow-up with me again in 4-6 weeks.

## 2017-05-15 NOTE — Progress Notes (Signed)
[facility] [address] [place], [place] [address] Phone: [phone] Subjective:    I'm seeing this patient by the request  of:    CC: Back pain follow-up  [id number]  Kristina Sanchez is a 51 y.o. female coming in with complaint of neck pain. Back pain. Patient has been seen for manipulation of multiple causes secondary to slipped rib syndrome. An states and is having some increasing neck pain. Was moving a significant number of down trees secondary to the store. Patient states that overall doing relatively well but unfortunately anytime she tries to increase activity has more pain.      Past Medical History:  Diagnosis Date  . ANEMIA-IRON DEFICIENCY 08/24/2009   Qualifier: Diagnosis of  By: Caryl Never MD, Bruce    . BACK PAIN, UPPER 10/14/2009   Qualifier: Diagnosis of  By: Caryl Never MD, Bruce    . DEPRESSION 08/24/2009   Qualifier: Diagnosis of  By: Caryl Never MD, Bruce     No past surgical history on file. Social History   Social History  . Marital status: Married    Spouse name: N/A  . Number of children: N/A  . Years of education: N/A   Social History Main Topics  . Smoking status: Never Smoker  . Smokeless tobacco: Never Used  . Alcohol use None  . Drug use: Unknown  . Sexual activity: Not Asked   Other Topics Concern  . None   Social History Narrative  . None   Allergies  Allergen Reactions  . Penicillins     REACTION: hives   Family History  Problem Relation Age of Onset  . Alcohol abuse Other        grandfather  . Arthritis Other        grandmother  . Stroke Other        grandfather, grandfather     Past medical history, social, surgical and family history all reviewed in electronic medical record.  No pertanent information unless stated regarding to the chief complaint.   Review of Systems:Review of systems updated and as accurate as of 05/15/17  No headache, visual changes, nausea, vomiting, diarrhea,  constipation, dizziness, abdominal pain, skin rash, fevers, chills, night sweats, weight loss, swollen lymph nodes, body aches, joint swelling, chest pain, shortness of breath, mood changes. Positive muscle aches  Objective  Blood pressure 120/70, pulse 72, height 5\' 2"  (1.575 m), weight 138 lb (62.6 kg), SpO2 97 %. Systems examined below as of 05/15/17   General: No apparent distress alert and oriented x3 mood and affect normal, dressed appropriately.  HEENT: Pupils equal, extraocular movements intact  Respiratory: Patient's speak in full sentences and does not appear short of breath  Cardiovascular: No lower extremity edema, non tender, no erythema  Skin: Warm dry intact with no signs of infection or rash on extremities or on axial skeleton.  Abdomen: Soft nontender  Neuro: Cranial nerves II through XII are intact, neurovascularly intact in all extremities with 2+ DTRs and 2+ pulses.  Lymph: No lymphadenopathy of posterior or anterior cervical chain or axillae bilaterally.  Gait normal with good balance and coordination.  MSK:  Non tender with full range of motion and good stability and symmetric strength and tone of shoulders, elbows, wrist, hip, knee and ankles bilaterally.  Neck: Inspection mild loss in lordosis. No palpable stepoffs. Negative Spurling's maneuver. Mild tightness with left-sided rotation and right-sided side bending Grip strength and sensation normal in bilateral hands Strength good C4 to  T1 distribution No sensory change to C4 to T1 Negative Hoffman sign bilaterally Reflexes normal  Upper back shows the patient does have some more tenderness in the parascapular regions bilaterally  Osteopathic findings C2 flexed rotated and side bent right C4 flexed rotated and side bent left C7 flexed rotated and side bent left T3 extended rotated and side bent right inhaled third rib T7 extended rotated and side bent left L1 flexed rotated and side bent right Sacrum right on  right    Impression and Recommendations:     This case required medical decision making of moderate complexity.      Note: This dictation was prepared with Dragon dictation along with smaller phrase technology. Any transcriptional errors that result from this process are unintentional.

## 2017-05-15 NOTE — Patient Instructions (Addendum)
Good to see you  Kristina Sanchez is your friend.  Zanaflex nightly if needed Keep the shoulder moving! See me again in 3 weeks.

## 2017-05-15 NOTE — Assessment & Plan Note (Signed)
Decision today to treat with OMT was based on Physical Exam  After verbal consent patient was treated with HVLA, ME, FPR techniques in cervical, thoracic, rib, lumbar and sacral areas  Patient tolerated the procedure well with improvement in symptoms  Patient given exercises, stretches and lifestyle modifications  See medications in patient instructions if given  Patient will follow up in 4-6 weeks 

## 2017-06-05 ENCOUNTER — Ambulatory Visit (INDEPENDENT_AMBULATORY_CARE_PROVIDER_SITE_OTHER): Payer: BLUE CROSS/BLUE SHIELD | Admitting: Family Medicine

## 2017-06-05 ENCOUNTER — Encounter: Payer: Self-pay | Admitting: Family Medicine

## 2017-06-05 VITALS — BP 134/80 | HR 66 | Ht 62.0 in | Wt 136.0 lb

## 2017-06-05 DIAGNOSIS — M940 Chondrocostal junction syndrome [Tietze]: Secondary | ICD-10-CM

## 2017-06-05 DIAGNOSIS — M999 Biomechanical lesion, unspecified: Secondary | ICD-10-CM | POA: Diagnosis not present

## 2017-06-05 NOTE — Assessment & Plan Note (Signed)
Decision today to treat with OMT was based on Physical Exam  After verbal consent patient was treated with HVLA, ME, FPR techniques in cervical, thoracic, rib, lumbar and sacral areas  Patient tolerated the procedure well with improvement in symptoms  Patient given exercises, stretches and lifestyle modifications  See medications in patient instructions if given  Patient will follow up in 8 weeks 

## 2017-06-05 NOTE — Patient Instructions (Signed)
Good to see you  I am so impressed  Keep it up  See me again in 6-8 weeks

## 2017-06-05 NOTE — Assessment & Plan Note (Signed)
Patient is improved.  No significant differences.  I do feel that the underlying anxiety could be contributing.  We start home exercise, icing regimen, which activities to do which wants to avoid.  Patient will continue to try to be active.  Patient will follow up with me again 8 weeks

## 2017-06-05 NOTE — Progress Notes (Signed)
Tawana ScaleZach Renarda Mullinix D.O. [facility] [address] [place], [place] [address] Phone: [phone] Subjective:     CC: Neck and back pain  BJY:[id number]:Subjective  Griffith CitronRandi K Sanchez is a 51 y.o. female coming in with complaint of neck and back pain. Staying active.  Has not felt this good in quite some time.     Past Medical History:  Diagnosis Date  . ANEMIA-IRON DEFICIENCY 08/24/2009   Qualifier: Diagnosis of  By: Caryl NeverBurchette MD, Bruce    . BACK PAIN, UPPER 10/14/2009   Qualifier: Diagnosis of  By: Caryl NeverBurchette MD, Bruce    . DEPRESSION 08/24/2009   Qualifier: Diagnosis of  By: Caryl NeverBurchette MD, Bruce     No past surgical history on file. Social History   Socioeconomic History  . Marital status: Married    Spouse name: None  . Number of children: None  . Years of education: None  . Highest education level: None  Social Needs  . Financial resource strain: None  . Food insecurity - worry: None  . Food insecurity - inability: None  . Transportation needs - medical: None  . Transportation needs - non-medical: None  Occupational History  . None  Tobacco Use  . Smoking status: Never Smoker  . Smokeless tobacco: Never Used  Substance and Sexual Activity  . Alcohol use: None  . Drug use: None  . Sexual activity: None  Other Topics Concern  . None  Social History Narrative  . None   Allergies  Allergen Reactions  . Penicillins     REACTION: hives   Family History  Problem Relation Age of Onset  . Alcohol abuse Other        grandfather  . Arthritis Other        grandmother  . Stroke Other        grandfather, grandfather     Past medical history, social, surgical and family history all reviewed in electronic medical record.  No pertanent information unless stated regarding to the chief complaint.   Review of Systems:Review of systems updated and as accurate as of 06/05/17  No headache, visual changes, nausea, vomiting, diarrhea, constipation, dizziness, abdominal pain, skin  rash, fevers, chills, night sweats, weight loss, swollen lymph nodes, body aches, joint swelling, muscle aches, chest pain, shortness of breath, mood changes.   Objective  Blood pressure 134/80, pulse 66, height 5\' 2"  (1.575 m), weight 136 lb (61.7 kg), SpO2 98 %. Systems examined below as of 06/05/17   General: No apparent distress alert and oriented x3 mood and affect normal, dressed appropriately.  HEENT: Pupils equal, extraocular movements intact  Respiratory: Patient's speak in full sentences and does not appear short of breath  Cardiovascular: No lower extremity edema, non tender, no erythema  Skin: Warm dry intact with no signs of infection or rash on extremities or on axial skeleton.  Abdomen: Soft nontender  Neuro: Cranial nerves II through XII are intact, neurovascularly intact in all extremities with 2+ DTRs and 2+ pulses.  Lymph: No lymphadenopathy of posterior or anterior cervical chain or axillae bilaterally.  Gait normal with good balance and coordination.  MSK:  Non tender with full range of motion and good stability and symmetric strength and tone of shoulders, elbows, wrist, hip, knee and ankles bilaterally.  Back Exam:  Inspection: Very mild increase in kyphosis of the upper back Motion: Flexion 45 deg, Extension 25 deg, Side Bending to 45 deg bilaterally,  Rotation to 45 deg bilaterally  SLR laying: Negative  XSLR laying: Negative  Palpable tenderness: Tender to palpation of the paraspinal musculature of the lumbar spine. FABER: negative. Sensory change: Gross sensation intact to all lumbar and sacral dermatomes.  Reflexes: 2+ at both patellar tendons, 2+ at achilles tendons, Babinski's downgoing.  Strength at foot  Plantar-flexion: 5/5 Dorsi-flexion: 5/5 Eversion: 5/5 Inversion: 5/5  Leg strength  Quad: 5/5 Hamstring: 5/5 Hip flexor: 5/5 Hip abductors: 5/5  Gait unremarkable.  Osteopathic findings C2 flexed rotated and side bent right C4 flexed rotated and side  bent left C7 flexed rotated and side bent left T3 extended rotated and side bent right inhaled third rib T6 extended rotated and side bent left L3 flexed rotated and side bent right Sacrum right on right    Impression and Recommendations:     This case required medical decision making of moderate complexity.      Note: This dictation was prepared with Dragon dictation along with smaller phrase technology. Any transcriptional errors that result from this process are unintentional.

## 2017-07-17 ENCOUNTER — Ambulatory Visit: Payer: BLUE CROSS/BLUE SHIELD | Admitting: Family Medicine

## 2017-07-17 ENCOUNTER — Encounter: Payer: Self-pay | Admitting: Family Medicine

## 2017-07-17 VITALS — BP 110/80 | HR 66 | Ht 62.0 in | Wt 135.0 lb

## 2017-07-17 DIAGNOSIS — M940 Chondrocostal junction syndrome [Tietze]: Secondary | ICD-10-CM | POA: Diagnosis not present

## 2017-07-17 DIAGNOSIS — M999 Biomechanical lesion, unspecified: Secondary | ICD-10-CM

## 2017-07-17 NOTE — Assessment & Plan Note (Signed)
Patient is more of a slipped rib syndrome.  We discussed icing regimen and home exercises.  We discussed which activities of doing which wants to avoid.  Patient will see me again in 6-8-week intervals

## 2017-07-17 NOTE — Assessment & Plan Note (Addendum)
Decision today to treat with OMT was based on Physical Exam  After verbal consent patient was treated with HVLA, ME, FPR techniques in cervical, thoracic, rib, lumbar and sacral areas  Patient tolerated the procedure well with improvement in symptoms  Patient given exercises, stretches and lifestyle modifications  See medications in patient instructions if given  Patient will follow up in 6-8 weeks 

## 2017-07-17 NOTE — Patient Instructions (Signed)
Good to see you  You are awesome Good luck with ella Happy holidays!  See me again in 6-8 weeks!

## 2017-07-17 NOTE — Progress Notes (Signed)
Kristina ScaleZach Toluwanimi Sanchez D.O. [facility] [address] [place], [place] [address] Phone: [phone] Subjective:     CC: neck and back pain   Kristina Sanchez:[id number]:Subjective  Kristina CitronRandi K Sanchez is a 51 y.o. female coming in with complaint of neck and back pain.  Patient is seen me multiple times for manipulation.  Patient has been doing relatively well. Patient states doing decentr, some more stress. Not done with shopping.        Past Medical History:  Diagnosis Date  . ANEMIA-IRON DEFICIENCY 08/24/2009   Qualifier: Diagnosis of  By: Caryl NeverBurchette MD, Bruce    . BACK PAIN, UPPER 10/14/2009   Qualifier: Diagnosis of  By: Caryl NeverBurchette MD, Bruce    . DEPRESSION 08/24/2009   Qualifier: Diagnosis of  By: Caryl NeverBurchette MD, Bruce     No past surgical history on file. Social History   Socioeconomic History  . Marital status: Married    Spouse name: None  . Number of children: None  . Years of education: None  . Highest education level: None  Social Needs  . Financial resource strain: None  . Food insecurity - worry: None  . Food insecurity - inability: None  . Transportation needs - medical: None  . Transportation needs - non-medical: None  Occupational History  . None  Tobacco Use  . Smoking status: Never Smoker  . Smokeless tobacco: Never Used  Substance and Sexual Activity  . Alcohol use: None  . Drug use: None  . Sexual activity: None  Other Topics Concern  . None  Social History Narrative  . None   Allergies  Allergen Reactions  . Penicillins     REACTION: hives   Family History  Problem Relation Age of Onset  . Alcohol abuse Other        grandfather  . Arthritis Other        grandmother  . Stroke Other        grandfather, grandfather     Past medical history, social, surgical and family history all reviewed in electronic medical record.  No pertanent information unless stated regarding to the chief complaint.   Review of Systems:Review of systems updated and as accurate as of  07/17/17  No headache, visual changes, nausea, vomiting, diarrhea, constipation, dizziness, abdominal pain, skin rash, fevers, chills, night sweats, weight loss, swollen lymph nodes, body aches, joint swelling, muscle aches, chest pain, shortness of breath, mood changes.   Objective  Blood pressure 110/80, pulse 66, height 5\' 2"  (1.575 m), weight 135 lb (61.2 kg), SpO2 98 %. Systems examined below as of 07/17/17   General: No apparent distress alert and oriented x3 mood and affect normal, dressed appropriately.  HEENT: Pupils equal, extraocular movements intact  Respiratory: Patient's speak in full sentences and does not appear short of breath  Cardiovascular: No lower extremity edema, non tender, no erythema  Skin: Warm dry intact with no signs of infection or rash on extremities or on axial skeleton.  Abdomen: Soft nontender  Neuro: Cranial nerves II through XII are intact, neurovascularly intact in all extremities with 2+ DTRs and 2+ pulses.  Lymph: No lymphadenopathy of posterior or anterior cervical chain or axillae bilaterally.  Gait normal with good balance and coordination.  MSK:  Non tender with full range of motion and good stability and symmetric strength and tone of shoulders, elbows, wrist, hip, knee and ankles bilaterally.  Neck: Inspection mild loss of lordosis.Marland Kitchen. No palpable stepoffs. Negative Spurling's maneuver. Full neck range of motion Grip  strength and sensation normal in bilateral hands Strength good C4 to T1 distribution No sensory change to C4 to T1 Negative Hoffman sign bilaterally Reflexes normal Mild scapular dyskinesis  Osteopathic findings C2 flexed rotated and side bent right C7 flexed rotated and side bent left T3 extended rotated and side bent right inhaled third rib T6 extended rotated and side bent left L2 flexed rotated and side bent right Sacrum right on right     Impression and Recommendations:     This case required medical decision  making of moderate complexity.      Note: This dictation was prepared with Dragon dictation along with smaller phrase technology. Any transcriptional errors that result from this process are unintentional.

## 2017-08-27 NOTE — Progress Notes (Signed)
[name] [address] [place], [place] [address] Phone: [phone] Subjective:    I'm seeing this patient by the request  of:    CC: Back and neck pain  OZH:YQMVHQIONG  Kristina Sanchez is a 52 y.o. female coming in with complaint of back and neck pain.  Significant increase in stress.  Patient would be welcoming for the potential Effexor at this time.  Was doing better with that.  Feels she is having increasing more stress as well.    Past Medical History:  Diagnosis Date  . ANEMIA-IRON DEFICIENCY 08/24/2009   Qualifier: Diagnosis of  By: Caryl Never MD, Bruce    . BACK PAIN, UPPER 10/14/2009   Qualifier: Diagnosis of  By: Caryl Never MD, Bruce    . DEPRESSION 08/24/2009   Qualifier: Diagnosis of  By: Caryl Never MD, Bruce     No past surgical history on file. Social History   Socioeconomic History  . Marital status: Married    Spouse name: None  . Number of children: None  . Years of education: None  . Highest education level: None  Social Needs  . Financial resource strain: None  . Food insecurity - worry: None  . Food insecurity - inability: None  . Transportation needs - medical: None  . Transportation needs - non-medical: None  Occupational History  . None  Tobacco Use  . Smoking status: Never Smoker  . Smokeless tobacco: Never Used  Substance and Sexual Activity  . Alcohol use: None  . Drug use: None  . Sexual activity: None  Other Topics Concern  . None  Social History Narrative  . None   Allergies  Allergen Reactions  . Penicillins     REACTION: hives   Family History  Problem Relation Age of Onset  . Alcohol abuse Other        grandfather  . Arthritis Other        grandmother  . Stroke Other        grandfather, grandfather     Past medical history, social, surgical and family history all reviewed in electronic medical record.  No pertanent information unless stated regarding to the chief complaint.   Review of  Systems:Review of systems updated and as accurate as of 08/28/17  No  visual changes, nausea, vomiting, diarrhea, constipation, dizziness, abdominal pain, skin rash, fevers, chills, night sweats, weight loss, swollen lymph nodes, body aches, joint swelling, chest pain, shortness of breath, mood changes.  Positive muscle aches and headache  Objective  Blood pressure 120/84, pulse 76, height 5\' 2"  (1.575 m), weight 134 lb (60.8 kg), SpO2 98 %. Systems examined below as of 08/28/17   General: No apparent distress alert and oriented x3 mood and affect normal, dressed appropriately.  HEENT: Pupils equal, extraocular movements intact  Respiratory: Patient's speak in full sentences and does not appear short of breath  Cardiovascular: No lower extremity edema, non tender, no erythema  Skin: Warm dry intact with no signs of infection or rash on extremities or on axial skeleton.  Abdomen: Soft nontender  Neuro: Cranial nerves II through XII are intact, neurovascularly intact in all extremities with 2+ DTRs and 2+ pulses.  Lymph: No lymphadenopathy of posterior or anterior cervical chain or axillae bilaterally.  Gait normal with good balance and coordination.  MSK:  Non tender with full range of motion and good stability and symmetric strength and tone of shoulders, elbows, wrist, hip, knee and ankles bilaterally.  Neck: Inspection loss  of lordosis. No palpable stepoffs. Negative Spurling's maneuver. Lacks last 5 degrees of extension as well as 5 degrees of sidebending bilaterally. Grip strength and sensation normal in bilateral hands Strength good C4 to T1 distribution No sensory change to C4 to T1 Negative Hoffman sign bilaterally Reflexes normal Tightness of the trapezius bilaterally Scapular dyskinesis noted on the right  Osteopathic findings  C4 flexed rotated and side bent left C6 flexed rotated and side bent left T3 extended rotated and side bent right inhaled third rib T6 extended  rotated and side bent left L2 flexed rotated and side bent right Sacrum right on right     Impression and Recommendations:     This case required medical decision making of moderate complexity.      Note: This dictation was prepared with Dragon dictation along with smaller phrase technology. Any transcriptional errors that result from this process are unintentional.

## 2017-08-28 ENCOUNTER — Ambulatory Visit: Payer: BLUE CROSS/BLUE SHIELD | Admitting: Family Medicine

## 2017-08-28 ENCOUNTER — Encounter: Payer: Self-pay | Admitting: Family Medicine

## 2017-08-28 VITALS — BP 120/84 | HR 76 | Ht 62.0 in | Wt 134.0 lb

## 2017-08-28 DIAGNOSIS — M940 Chondrocostal junction syndrome [Tietze]: Secondary | ICD-10-CM | POA: Diagnosis not present

## 2017-08-28 DIAGNOSIS — M999 Biomechanical lesion, unspecified: Secondary | ICD-10-CM

## 2017-08-28 MED ORDER — VENLAFAXINE HCL ER 37.5 MG PO CP24: 37.5000 mg | ORAL_CAPSULE | Freq: Every day | ORAL | 1 refills | Status: DC

## 2017-08-28 NOTE — Patient Instructions (Signed)
Good to see you  Effexor 37.5 mg  Overall not as bad as I thought BREATHE See em again in 3 week s

## 2017-08-28 NOTE — Assessment & Plan Note (Signed)
Decision today to treat with OMT was based on Physical Exam  After verbal consent patient was treated with HVLA, ME, FPR techniques in cervical, thoracic, rib, lumbar and sacral areas  Patient tolerated the procedure well with improvement in symptoms  Patient given exercises, stretches and lifestyle modifications  See medications in patient instructions if given  Patient will follow up in 4 weeks 

## 2017-08-28 NOTE — Assessment & Plan Note (Signed)
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  Discussed icing regimen and home exercises.  Increase activity slowly.  Follow-up again in 4 weeks

## 2017-08-28 NOTE — Assessment & Plan Note (Signed)
Worsening anxiety and depression.  Started on Effexor denies any suicidal or homicidal ideation.

## 2017-09-17 NOTE — Progress Notes (Signed)
[name] D.O.  [facility] [address] [place], [place] [address] Phone: [phone] Subjective:      CC: Back pain follow-up  OZH:YQMVHQIONGHPI:Subjective  Griffith CitronRandi K Sanchez is a 52 y.o. female coming in with complaint of back pain.  Patient has had back pain for quite some time.  Seems to be more of a poor posture.  Unable to tolerate the Effexor.  Patient states that it is making her nauseous.  He is having increasing feels like she has getting frustrated.     Past Medical History:  Diagnosis Date  . ANEMIA-IRON DEFICIENCY 08/24/2009   Qualifier: Diagnosis of  By: Caryl NeverBurchette MD, Bruce    . BACK PAIN, UPPER 10/14/2009   Qualifier: Diagnosis of  By: Caryl NeverBurchette MD, Bruce    . DEPRESSION 08/24/2009   Qualifier: Diagnosis of  By: Caryl NeverBurchette MD, Bruce     No past surgical history on file. Social History   Socioeconomic History  . Marital status: Married    Spouse name: Not on file  . Number of children: Not on file  . Years of education: Not on file  . Highest education level: Not on file  Social Needs  . Financial resource strain: Not on file  . Food insecurity - worry: Not on file  . Food insecurity - inability: Not on file  . Transportation needs - medical: Not on file  . Transportation needs - non-medical: Not on file  Occupational History  . Not on file  Tobacco Use  . Smoking status: Never Smoker  . Smokeless tobacco: Never Used  Substance and Sexual Activity  . Alcohol use: Not on file  . Drug use: Not on file  . Sexual activity: Not on file  Other Topics Concern  . Not on file  Social History Narrative  . Not on file   Allergies  Allergen Reactions  . Penicillins     REACTION: hives   Family History  Problem Relation Age of Onset  . Alcohol abuse Other        grandfather  . Arthritis Other        grandmother  . Stroke Other        grandfather, grandfather     Past medical history, social, surgical and family history all reviewed in electronic medical  record.  No pertanent information unless stated regarding to the chief complaint.   Review of Systems:Review of systems updated and as accurate as of 09/17/17  No headache, visual changes, nausea, vomiting, diarrhea, constipation, dizziness, abdominal pain, skin rash, fevers, chills, night sweats, weight loss, swollen lymph nodes, body aches, joint swelling, muscle aches, chest pain, shortness of breath, mood changes.   Objective  There were no vitals taken for this visit. Systems examined below as of 09/17/17   General: No apparent distress alert and oriented x3 mood and affect normal, dressed appropriately.  HEENT: Pupils equal, extraocular movements intact  Respiratory: Patient's speak in full sentences and does not appear short of breath  Cardiovascular: No lower extremity edema, non tender, no erythema  Skin: Warm dry intact with no signs of infection or rash on extremities or on axial skeleton.  Abdomen: Soft nontender  Neuro: Cranial nerves II through XII are intact, neurovascularly intact in all extremities with 2+ DTRs and 2+ pulses.  Lymph: No lymphadenopathy of posterior or anterior cervical chain or axillae bilaterally.  Gait normal with good balance and coordination.  MSK:  Non tender with full range of motion and good stability and  symmetric strength and tone of shoulders, elbows, wrist, hip, knee and ankles bilaterally.  Back Exam:  Inspection: Unremarkable  Motion: Flexion 45 deg, Extension 25 deg, Side Bending to 35 deg bilaterally,  Rotation to 30 deg bilaterally  SLR laying: Negative  XSLR laying: Negative  Palpable tenderness: Tender to palpation of the paraspinal musculature lumbar spine.  More in the thoracolumbar juncture. FABER: negative. Sensory change: Gross sensation intact to all lumbar and sacral dermatomes.  Reflexes: 2+ at both patellar tendons, 2+ at achilles tendons, Babinski's downgoing.  Strength at foot  4-5 but symmetric  Neck pain shows some mild  lordosis.  Does have some mild discomfort with any range of motion at the moment.  Grip strength is symmetric.   Osteopathic findings C2 flexed rotated and side bent right C4 flexed rotated and side bent left C6 flexed rotated and side bent left T3 extended rotated and side bent right inhaled third rib T6 extended rotated and side bent left L2 flexed rotated and side bent right Sacrum right on right     Impression and Recommendations:     This case required medical decision making of moderate complexity.      Note: This dictation was prepared with Dragon dictation along with smaller phrase technology. Any transcriptional errors that result from this process are unintentional.

## 2017-09-18 ENCOUNTER — Encounter: Payer: Self-pay | Admitting: Family Medicine

## 2017-09-18 ENCOUNTER — Ambulatory Visit: Payer: BLUE CROSS/BLUE SHIELD | Admitting: Family Medicine

## 2017-09-18 VITALS — BP 124/82 | HR 86 | Ht 62.0 in | Wt 135.0 lb

## 2017-09-18 DIAGNOSIS — M999 Biomechanical lesion, unspecified: Secondary | ICD-10-CM

## 2017-09-18 DIAGNOSIS — M940 Chondrocostal junction syndrome [Tietze]: Secondary | ICD-10-CM | POA: Diagnosis not present

## 2017-09-18 MED ORDER — DULOXETINE HCL 20 MG PO CPEP: 20.0000 mg | ORAL_CAPSULE | Freq: Every day | ORAL | 3 refills | Status: DC

## 2017-09-18 NOTE — Assessment & Plan Note (Signed)
Decision today to treat with OMT was based on Physical Exam  After verbal consent patient was treated with HVLA, ME, FPR techniques in cervical, thoracic, rib lumbar and sacral areas  Patient tolerated the procedure well with improvement in symptoms  Patient given exercises, stretches and lifestyle modifications  See medications in patient instructions if given  Patient will follow up in 4 weeks 

## 2017-09-18 NOTE — Assessment & Plan Note (Signed)
Slipped rib syndrome.  Encourage patient to try the Cymbalta.  Discontinue the Effexor.  Follow-up again in 4 weeks.

## 2017-09-18 NOTE — Patient Instructions (Signed)
Good to see you  Lets try a little cymbalta 20 mg daily and see how it goes.  Can use the muscle relaxer at night See me again in 4 weeks

## 2017-10-15 NOTE — Progress Notes (Signed)
Kristina Sanchez D.O. [facility] [address] [place], [place] [address] Phone: [phone] Subjective:       CC: Back and neck pain follow-up  BJY:[id number]:Subjective  Kristina CitronRandi K Sanchez is a 52 y.o. female coming in with complaint of back and neck pain follow-up.  Patient was found to have posture and ergonomics.  Patient still has significant stress patient given Cymbalta.  States that it is helped out some of the anxiety but still not perfect.  Denies any suicidal or homicidal ideation.     Past Medical History:  Diagnosis Date  . ANEMIA-IRON DEFICIENCY 08/24/2009   Qualifier: Diagnosis of  By: Caryl NeverBurchette MD, Bruce    . BACK PAIN, UPPER 10/14/2009   Qualifier: Diagnosis of  By: Caryl NeverBurchette MD, Bruce    . DEPRESSION 08/24/2009   Qualifier: Diagnosis of  By: Caryl NeverBurchette MD, Bruce     No past surgical history on file. Social History   Socioeconomic History  . Marital status: Married    Spouse name: Not on file  . Number of children: Not on file  . Years of education: Not on file  . Highest education level: Not on file  Occupational History  . Not on file  Social Needs  . Financial resource strain: Not on file  . Food insecurity:    Worry: Not on file    Inability: Not on file  . Transportation needs:    Medical: Not on file    Non-medical: Not on file  Tobacco Use  . Smoking status: Never Smoker  . Smokeless tobacco: Never Used  Substance and Sexual Activity  . Alcohol use: Not on file  . Drug use: Not on file  . Sexual activity: Not on file  Lifestyle  . Physical activity:    Days per week: Not on file    Minutes per session: Not on file  . Stress: Not on file  Relationships  . Social connections:    Talks on phone: Not on file    Gets together: Not on file    Attends religious service: Not on file    Active member of club or organization: Not on file    Attends meetings of clubs or organizations: Not on file    Relationship status: Not on file  Other Topics Concern   . Not on file  Social History Narrative  . Not on file   Allergies  Allergen Reactions  . Penicillins     REACTION: hives   Family History  Problem Relation Age of Onset  . Alcohol abuse Other        grandfather  . Arthritis Other        grandmother  . Stroke Other        grandfather, grandfather     Past medical history, social, surgical and family history all reviewed in electronic medical record.  No pertanent information unless stated regarding to the chief complaint.   Review of Systems:Review of systems updated and as accurate as of 10/16/17  No headache, visual changes, nausea, vomiting, diarrhea, constipation, dizziness, abdominal pain, skin rash, fevers, chills, night sweats, weight loss, swollen lymph nodes, body aches, joint swelling, chest pain, shortness of breath, mood changes.  Positive muscle aches  Objective  Blood pressure 126/80, height 5\' 2"  (1.575 m), weight 136 lb (61.7 kg). Systems examined below as of 10/16/17   General: No apparent distress alert and oriented x3 mood and affect normal, dressed appropriately.  HEENT: Pupils equal, extraocular movements intact  Respiratory: Patient's speak in full sentences and does not appear short of breath  Cardiovascular: No lower extremity edema, non tender, no erythema  Skin: Warm dry intact with no signs of infection or rash on extremities or on axial skeleton.  Abdomen: Soft nontender  Neuro: Cranial nerves II through XII are intact, neurovascularly intact in all extremities with 2+ DTRs and 2+ pulses.  Lymph: No lymphadenopathy of posterior or anterior cervical chain or axillae bilaterally.  Gait normal with good balance and coordination.  MSK:  Non tender with full range of motion and good stability and symmetric strength and tone of shoulders, elbows, wrist, hip, knee and ankles bilaterally.  Neck: Inspection mild loss of lordosis. No palpable stepoffs. Negative Spurling's maneuver. Mild loss of rotation  noted. Grip strength and sensation normal in bilateral hands Strength good C4 to T1 distribution No sensory change to C4 to T1 Negative Hoffman sign bilaterally Reflexes normal Significant tightness of the trapezius bilaterally  Osteopathic findings C2 flexed rotated and side bent right C4 flexed rotated and side bent left C7 flexed rotated and side bent left T3 extended rotated and side bent right inhaled third rib T9 extended rotated and side bent left L3 flexed rotated and side bent right Sacrum right on right     Impression and Recommendations:     This case required medical decision making of moderate complexity.      Note: This dictation was prepared with Dragon dictation along with smaller phrase technology. Any transcriptional errors that result from this process are unintentional.

## 2017-10-16 ENCOUNTER — Ambulatory Visit: Payer: BLUE CROSS/BLUE SHIELD | Admitting: Family Medicine

## 2017-10-16 ENCOUNTER — Encounter: Payer: Self-pay | Admitting: Family Medicine

## 2017-10-16 VITALS — BP 126/80 | HR 85 | Ht 62.0 in | Wt 136.0 lb

## 2017-10-16 DIAGNOSIS — M940 Chondrocostal junction syndrome [Tietze]: Secondary | ICD-10-CM

## 2017-10-16 DIAGNOSIS — M999 Biomechanical lesion, unspecified: Secondary | ICD-10-CM | POA: Diagnosis not present

## 2017-10-16 NOTE — Assessment & Plan Note (Signed)
Decision today to treat with OMT was based on Physical Exam  After verbal consent patient was treated with HVLA, ME, FPR techniques in cervical, thoracic, rib lumbar and sacral areas  Patient tolerated the procedure well with improvement in symptoms  Patient given exercises, stretches and lifestyle modifications  See medications in patient instructions if given  Patient will follow up in 4 weeks 

## 2017-10-16 NOTE — Assessment & Plan Note (Signed)
Continues to be difficult.  Patient does have underlying depression that I think is contributing.  Encourage patient to continue the Cymbalta at the same dose.  Discussed icing regimen and home exercises.  Discussed posture and ergonomics.  Follow-up again in 4-6 weeks

## 2017-10-16 NOTE — Patient Instructions (Signed)
You are awesome,  Facial expressions..I have attempted to contact this patient by telephone, but there is no answer repeatedly. I will continue to try later. So much ;) See me again in 4 weeks

## 2017-11-18 NOTE — Progress Notes (Signed)
[name] [address] [place], [place] [address] Phone: [phone] Subjective:     CC: Neck pain.  Kristina Sanchez  Kristina Sanchez is a 52 y.o. female coming in with complaint of back pain. She has not had any problems since last visit.  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  Not as stressed recently.  Patient has responded well to manipulation.  Doing well with all medicines.  No big changes otherwise.    Past Medical History:  Diagnosis Date  . ANEMIA-IRON DEFICIENCY 08/24/2009   Qualifier: Diagnosis of  By: Caryl Never MD, Bruce    . BACK PAIN, UPPER 10/14/2009   Qualifier: Diagnosis of  By: Caryl Never MD, Bruce    . DEPRESSION 08/24/2009   Qualifier: Diagnosis of  By: Caryl Never MD, Bruce     No past surgical history on file. Social History   Socioeconomic History  . Marital status: Married    Spouse name: Not on file  . Number of children: Not on file  . Years of education: Not on file  . Highest education level: Not on file  Occupational History  . Not on file  Social Needs  . Financial resource strain: Not on file  . Food insecurity:    Worry: Not on file    Inability: Not on file  . Transportation needs:    Medical: Not on file    Non-medical: Not on file  Tobacco Use  . Smoking status: Never Smoker  . Smokeless tobacco: Never Used  Substance and Sexual Activity  . Alcohol use: Not on file  . Drug use: Not on file  . Sexual activity: Not on file  Lifestyle  . Physical activity:    Days per week: Not on file    Minutes per session: Not on file  . Stress: Not on file  Relationships  . Social connections:    Talks on phone: Not on file    Gets together: Not on file    Attends religious service: Not on file    Active member of club or organization: Not on file    Attends meetings of clubs or organizations: Not on file    Relationship status: Not on file  Other Topics Concern  . Not on file  Social History Narrative  . Not  on file   Allergies  Allergen Reactions  . Penicillins     REACTION: hives   Family History  Problem Relation Age of Onset  . Alcohol abuse Other        grandfather  . Arthritis Other        grandmother  . Stroke Other        grandfather, grandfather     Past medical history, social, surgical and family history all reviewed in electronic medical record.  No pertanent information unless stated regarding to the chief complaint.   Review of Systems:Review of systems updated and as accurate as of 11/19/17  No headache, visual changes, nausea, vomiting, diarrhea, constipation, dizziness, abdominal pain, skin rash, fevers, chills, night sweats, weight loss, swollen lymph nodes, body aches, joint swelling,  chest pain, shortness of breath, mood changes.  Mild positive muscle aches  Objective  Blood pressure 110/80, pulse 76, height 5\' 3"  (1.6 m), weight 133 lb (60.3 kg), SpO2 98 %. Systems examined below as of 11/19/17   General: No apparent distress alert and oriented x3 mood and affect normal, dressed appropriately.  HEENT: Pupils equal, extraocular movements intact  Respiratory: Patient's  speak in full sentences and does not appear short of breath  Cardiovascular: No lower extremity edema, non tender, no erythema  Skin: Warm dry intact with no signs of infection or rash on extremities or on axial skeleton.  Abdomen: Soft nontender  Neuro: Cranial nerves II through XII are intact, neurovascularly intact in all extremities with 2+ DTRs and 2+ pulses.  Lymph: No lymphadenopathy of posterior or anterior cervical chain or axillae bilaterally.  Gait normal with good balance and coordination.  MSK:  Non tender with full range of motion and good stability and symmetric strength and tone of shoulders, elbows, wrist, hip, knee and ankles bilaterally.  Back Exam:  Inspection: Unremarkable  Motion: Flexion 40 deg, Extension 25 deg, Side Bending to 45 deg bilaterally,  Rotation to 45 deg  bilaterally  SLR laying: Negative  XSLR laying: Negative  Palpable tenderness: Tender to palpation in the thoracolumbar junction right greater than left.Marland Kitchen. FABER: negative. Sensory change: Gross sensation intact to all lumbar and sacral dermatomes.  Reflexes: 2+ at both patellar tendons, 2+ at achilles tendons, Babinski's downgoing.  Strength at foot  Plantar-flexion: 5/5 Dorsi-flexion: 5/5 Eversion: 5/5 Inversion: 5/5  Leg strength  Quad: 5/5 Hamstring: 5/5 Hip flexor: 5/5 Hip abductors: 5/5  Gait unremarkable. Neck exam shows some very mild tightness like in the last 5 degrees of flexion.  Tightness of the trapezius bilaterally. Hypermobility is noted though overall  Osteopathic findings C2 flexed rotated and side bent right C4 flexed rotated and side bent left C7 flexed rotated and side bent left T3 extended rotated and side bent right inhaled third rib T9 extended rotated and side bent left L2 flexed rotated and side bent right Sacrum right on right    Impression and Recommendations:     This case required medical decision making of moderate complexity.      Note: This dictation was prepared with Dragon dictation along with smaller phrase technology. Any transcriptional errors that result from this process are unintentional.

## 2017-11-19 ENCOUNTER — Ambulatory Visit: Payer: BLUE CROSS/BLUE SHIELD | Admitting: Family Medicine

## 2017-11-19 ENCOUNTER — Encounter: Payer: Self-pay | Admitting: Family Medicine

## 2017-11-19 VITALS — BP 110/80 | HR 76 | Ht 63.0 in | Wt 133.0 lb

## 2017-11-19 DIAGNOSIS — M999 Biomechanical lesion, unspecified: Secondary | ICD-10-CM

## 2017-11-19 DIAGNOSIS — M940 Chondrocostal junction syndrome [Tietze]: Secondary | ICD-10-CM | POA: Diagnosis not present

## 2017-11-19 NOTE — Patient Instructions (Signed)
Good to see you  Ice is your friend I will call you;) (probabaly not but will think of you often) See me again in 4 weeks

## 2017-11-19 NOTE — Assessment & Plan Note (Signed)
Decision today to treat with OMT was based on Physical Exam  After verbal consent patient was treated with HVLA, ME, FPR techniques in cervical, thoracic, rib,  lumbar and sacral areas  Patient tolerated the procedure well with improvement in symptoms  Patient given exercises, stretches and lifestyle modifications  See medications in patient instructions if given  Patient will follow up in 4-8 weeks 

## 2017-11-19 NOTE — Assessment & Plan Note (Signed)
Continues to respond well to occipital manipulation.  And still doing well on the low-dose of Cymbalta.  Encourage patient to continue to stay active.  Discussed with activity which wants to avoid.  Patient is to increase activity slowly.  Follow-up again in 4 to 8 weeks

## 2017-12-17 ENCOUNTER — Ambulatory Visit: Payer: BLUE CROSS/BLUE SHIELD | Admitting: Family Medicine

## 2017-12-17 ENCOUNTER — Ambulatory Visit: Payer: Self-pay

## 2017-12-17 ENCOUNTER — Encounter: Payer: Self-pay | Admitting: Family Medicine

## 2017-12-17 VITALS — BP 128/70 | HR 61 | Ht 63.0 in | Wt 133.0 lb

## 2017-12-17 DIAGNOSIS — M533 Sacrococcygeal disorders, not elsewhere classified: Secondary | ICD-10-CM

## 2017-12-17 DIAGNOSIS — G8929 Other chronic pain: Secondary | ICD-10-CM

## 2017-12-17 DIAGNOSIS — M999 Biomechanical lesion, unspecified: Secondary | ICD-10-CM | POA: Diagnosis not present

## 2017-12-17 DIAGNOSIS — M25512 Pain in left shoulder: Secondary | ICD-10-CM

## 2017-12-17 DIAGNOSIS — M7552 Bursitis of left shoulder: Secondary | ICD-10-CM | POA: Diagnosis not present

## 2017-12-17 NOTE — Patient Instructions (Signed)
Good to see you  Kristina Sanchez is your friend.  Stay active Injected the shoulder and I hope it helps See me again in 4 weeks

## 2017-12-17 NOTE — Assessment & Plan Note (Signed)
Stable.  Encourage patient to take the medication more regular basis.  Discussed home exercise and icing regimen.  Patient is doing relatively well.  Follow-up again in 4 to 8 weeks

## 2017-12-17 NOTE — Assessment & Plan Note (Signed)
Decision today to treat with OMT was based on Physical Exam  After verbal consent patient was treated with HVLA, ME, FPR techniques in cervical, thoracic, rib,  lumbar and sacral areas  Patient tolerated the procedure well with improvement in symptoms  Patient given exercises, stretches and lifestyle modifications  See medications in patient instructions if given  Patient will follow up in 4-8 weeks 

## 2017-12-17 NOTE — Assessment & Plan Note (Signed)
Injected.  Home exercise given, icing regimen, which activities to do which was to avoid.  Patient is to follow-up with me again in 4 to 8 weeks

## 2017-12-17 NOTE — Progress Notes (Signed)
[name] [address] [place], [place] [address] Phone: [phone] Subjective:     CC: Back and neck pain follow-up  [id number]  ANGI GOODELL is a 52 y.o. female coming in with complaint of back and neck pain follow-up.  Seems to be musculoskeletal.  Has been responding fairly well to manipulation.  Patient states some tightness but nothing severe.  Not taking any true medications for it on a regular basis.  Patient was having more of a left shoulder pain.  Worsening.  Seems to hurt in certain range of motion can wake her up at night.  No radiation to the hand or any numbness or weakness.  Rates the severity of pain now is 7 out of 10      Past Medical History:  Diagnosis Date  . ANEMIA-IRON DEFICIENCY 08/24/2009   Qualifier: Diagnosis of  By: Caryl Never MD, Bruce    . BACK PAIN, UPPER 10/14/2009   Qualifier: Diagnosis of  By: Caryl Never MD, Bruce    . DEPRESSION 08/24/2009   Qualifier: Diagnosis of  By: Caryl Never MD, Bruce     No past surgical history on file. Social History   Socioeconomic History  . Marital status: Married    Spouse name: Not on file  . Number of children: Not on file  . Years of education: Not on file  . Highest education level: Not on file  Occupational History  . Not on file  Social Needs  . Financial resource strain: Not on file  . Food insecurity:    Worry: Not on file    Inability: Not on file  . Transportation needs:    Medical: Not on file    Non-medical: Not on file  Tobacco Use  . Smoking status: Never Smoker  . Smokeless tobacco: Never Used  Substance and Sexual Activity  . Alcohol use: Not on file  . Drug use: Not on file  . Sexual activity: Not on file  Lifestyle  . Physical activity:    Days per week: Not on file    Minutes per session: Not on file  . Stress: Not on file  Relationships  . Social connections:    Talks on phone: Not on file    Gets together: Not on file    Attends religious  service: Not on file    Active member of club or organization: Not on file    Attends meetings of clubs or organizations: Not on file    Relationship status: Not on file  Other Topics Concern  . Not on file  Social History Narrative  . Not on file   Allergies  Allergen Reactions  . Penicillins     REACTION: hives   Family History  Problem Relation Age of Onset  . Alcohol abuse Other        grandfather  . Arthritis Other        grandmother  . Stroke Other        grandfather, grandfather     Past medical history, social, surgical and family history all reviewed in electronic medical record.  No pertanent information unless stated regarding to the chief complaint.   Review of Systems:Review of systems updated and as accurate as of 12/17/17  No headache, visual changes, nausea, vomiting, diarrhea, constipation, dizziness, abdominal pain, skin rash, fevers, chills, night sweats, weight loss, swollen lymph nodes, body aches, joint swelling, muscle aches, chest pain, shortness of breath, mood changes.   Objective  There  were no vitals taken for this visit. Systems examined below as of 12/17/17   General: No apparent distress alert and oriented x3 mood and affect normal, dressed appropriately.  HEENT: Pupils equal, extraocular movements intact  Respiratory: Patient's speak in full sentences and does not appear short of breath  Cardiovascular: No lower extremity edema, non tender, no erythema  Skin: Warm dry intact with no signs of infection or rash on extremities or on axial skeleton.  Abdomen: Soft nontender  Neuro: Cranial nerves II through XII are intact, neurovascularly intact in all extremities with 2+ DTRs and 2+ pulses.  Lymph: No lymphadenopathy of posterior or anterior cervical chain or axillae bilaterally.  Gait normal with good balance and coordination.  MSK:  Non tender with full range of motion and good stability and symmetric strength and tone of  elbows, wrist, hip,  knee and ankles bilaterally.  Shoulder: left Inspection reveals no abnormalities, atrophy or asymmetry. Palpation is normal with no tenderness over AC joint or bicipital groove. ROM is full in all planes passively. Rotator cuff strength normal throughout. signs of impingement with positive Neer and Hawkin's tests, but negative empty can sign. Speeds and Yergason's tests normal. No labral pathology noted with negative Obrien's, negative clunk and good stability. Normal scapular function observed. No painful arc and no drop arm sign. No apprehension sign  MSK US performed of: left This study was ordered, performed, and interpreted by Terrilee Files D.O.  Shoulder:   Supraspinatus:  Appears normal on long and transverse views, Bursal bulge seen with shoulder abduction on impingement view. Infraspinatus:  Appears normal on long and transverse views. Significant increase in Doppler flow Subscapularis:  Appears normal on long and transverse views. Positive bursa Teres Minor:  Appears normal on long and transverse views. AC joint:  Capsule undistended, no geyser sign. Glenohumeral Joint:  Appears normal without effusion. Glenoid Labrum:  Intact without visualized tears. Biceps Tendon:  Appears normal on long and transverse views, no fraying of tendon, tendon located in intertubercular groove, no subluxation with shoulder internal or external rotation.  Impression: Subacromial bursitis  Procedure: Real-time Ultrasound Guided Injection of left glenohumeral joint Device: GE Logiq E  Ultrasound guided injection is preferred based studies that show increased duration, increased effect, greater accuracy, decreased procedural pain, increased response rate with ultrasound guided versus blind injection.  Verbal informed consent obtained.  Time-out conducted.  Noted no overlying erythema, induration, or other signs of local infection.  Skin prepped in a sterile fashion.  Local anesthesia: Topical Ethyl  chloride.  With sterile technique and under real time ultrasound guidance:  Joint visualized.  23g 1  inch needle inserted posterior approach. Pictures taken for needle placement. Patient did have injection of 2 cc of 1% lidocaine, 2 cc of 0.5% Marcaine, and 1.0 cc of Kenalog 40 mg/dL. Completed without difficulty  Pain immediately resolved suggesting accurate placement of the medication.  Advised to call if fevers/chills, erythema, induration, drainage, or persistent bleeding.  Images permanently stored and available for review in the ultrasound unit.  Impression: Technically successful ultrasound guided injection.  Neck exam shows some mild loss of lordosis.  Mild limited range of motion with left-sided sidebending and rotation tightness of the left trapezius.  Grip strength is intact and symmetric    Osteopathic findings  C2 flexed rotated and side bent right C4 flexed rotated and side bent left C6 flexed rotated and side bent left T3 extended rotated and side bent left inhaled third rib T9 extended rotated and  side bent left L2 flexed rotated and side bent right Sacrum right on right  Impression and Recommendations:     This case required medical decision making of moderate complexity.      Note: This dictation was prepared with Dragon dictation along with smaller phrase technology. Any transcriptional errors that result from this process are unintentional.

## 2018-01-20 NOTE — Progress Notes (Signed)
Kristina Sanchez D.O. [facility] [address] [place], [place] [address] Phone: [phone]([phone] Subjective:     CC: Neck and back pain follow-up  KGM:[id number]:Subjective  Kristina CitronRandi K Sanchez is a 52 y.o. female coming in with complaint of neck and back pain follow-up.  Patient states overall has been doing relatively well.  Has been traveling recently.  Not sleeping her bed that seems to be giving some increasing discomfort and pain.  Denies any numbness or tingling.  Still has some tightness.  Feels like that her shoulder is less tender than it is been no recently.     Past Medical History:  Diagnosis Date  . ANEMIA-IRON DEFICIENCY 08/24/2009   Qualifier: Diagnosis of  By: Caryl NeverBurchette MD, Bruce    . BACK PAIN, UPPER 10/14/2009   Qualifier: Diagnosis of  By: Caryl NeverBurchette MD, Bruce    . DEPRESSION 08/24/2009   Qualifier: Diagnosis of  By: Caryl NeverBurchette MD, Bruce     No past surgical history on file. Social History   Socioeconomic History  . Marital status: Married    Spouse name: Not on file  . Number of children: Not on file  . Years of education: Not on file  . Highest education level: Not on file  Occupational History  . Not on file  Social Needs  . Financial resource strain: Not on file  . Food insecurity:    Worry: Not on file    Inability: Not on file  . Transportation needs:    Medical: Not on file    Non-medical: Not on file  Tobacco Use  . Smoking status: Never Smoker  . Smokeless tobacco: Never Used  Substance and Sexual Activity  . Alcohol use: Not on file  . Drug use: Not on file  . Sexual activity: Not on file  Lifestyle  . Physical activity:    Days per week: Not on file    Minutes per session: Not on file  . Stress: Not on file  Relationships  . Social connections:    Talks on phone: Not on file    Gets together: Not on file    Attends religious service: Not on file    Active member of club or organization: Not on file    Attends meetings of clubs or organizations:  Not on file    Relationship status: Not on file  Other Topics Concern  . Not on file  Social History Narrative  . Not on file   Allergies  Allergen Reactions  . Penicillins     REACTION: hives   Family History  Problem Relation Age of Onset  . Alcohol abuse Other        grandfather  . Arthritis Other        grandmother  . Stroke Other        grandfather, grandfather     Past medical history, social, surgical and family history all reviewed in electronic medical record.  No pertanent information unless stated regarding to the chief complaint.   Review of Systems:Review of systems updated and as accurate as of 01/21/18  No headache, visual changes, nausea, vomiting, diarrhea, constipation, dizziness, abdominal pain, skin rash, fevers, chills, night sweats, weight loss, swollen lymph nodes, body aches, joint swelling, hest pain, shortness of breath, mood changes.  Positive muscle aches  Objective  Blood pressure 104/78, pulse 78, height 5\' 3"  (1.6 m), weight 132 lb (59.9 kg), SpO2 98 %. Systems examined below as of 01/21/18   General: No apparent  distress alert and oriented x3 mood and affect normal, dressed appropriately.  HEENT: Pupils equal, extraocular movements intact  Respiratory: Patient's speak in full sentences and does not appear short of breath  Cardiovascular: No lower extremity edema, non tender, no erythema  Skin: Warm dry intact with no signs of infection or rash on extremities or on axial skeleton.  Abdomen: Soft nontender  Neuro: Cranial nerves II through XII are intact, neurovascularly intact in all extremities with 2+ DTRs and 2+ pulses.  Lymph: No lymphadenopathy of posterior or anterior cervical chain or axillae bilaterally.  Gait normal with good balance and coordination.  MSK:  Non tender with full range of motion and good stability and symmetric strength and tone of shoulders, elbows, wrist, hip, knee and ankles bilaterally.  Mild increase in flexibility  of all joints Neck: Inspection loss of lordosis. No palpable stepoffs. Negative Spurling's maneuver. Full neck range of motion Grip strength and sensation normal in bilateral hands Strength good C4 to T1 distribution No sensory change to C4 to T1 Negative Hoffman sign bilaterally Reflexes normal Positive muscle aches  Osteopathic findings C2 flexed rotated and side bent right T3 extended rotated and side bent right inhaled third rib T7 extended rotated and side bent left L2 flexed rotated and side bent right Sacrum right on right     Impression and Recommendations:     This case required medical decision making of moderate complexity.      Note: This dictation was prepared with Dragon dictation along with smaller phrase technology. Any transcriptional errors that result from this process are unintentional.

## 2018-01-21 ENCOUNTER — Ambulatory Visit: Payer: BLUE CROSS/BLUE SHIELD | Admitting: Family Medicine

## 2018-01-21 ENCOUNTER — Encounter: Payer: Self-pay | Admitting: Family Medicine

## 2018-01-21 VITALS — BP 104/78 | HR 78 | Ht 63.0 in | Wt 132.0 lb

## 2018-01-21 DIAGNOSIS — M999 Biomechanical lesion, unspecified: Secondary | ICD-10-CM | POA: Diagnosis not present

## 2018-01-21 DIAGNOSIS — M533 Sacrococcygeal disorders, not elsewhere classified: Secondary | ICD-10-CM | POA: Diagnosis not present

## 2018-01-21 NOTE — Assessment & Plan Note (Signed)
Tender to palpation the paraspinal musculature lumbar spine as well as somewhat in the thoracolumbar juncture.  Overall patient has done relatively well.  We discussed core stability.  Continue same medications.  Follow-up again in 4 to 6 weeks

## 2018-01-21 NOTE — Assessment & Plan Note (Addendum)
Decision today to treat with OMT was based on Physical Exam  After verbal consent patient was treated with HVLA, ME, FPR techniques in cervical, thoracic, rib, lumbar and sacral areas  Patient tolerated the procedure well with improvement in symptoms  Patient given exercises, stretches and lifestyle modifications  See medications in patient instructions if given  Patient will follow up in 4-6 weeks 

## 2018-01-21 NOTE — Patient Instructions (Signed)
Good to see you  Kristina Sanchez is your friend.  Stay active.  Keep working on the posture through the day  See me again in 6 weeks

## 2018-03-10 NOTE — Progress Notes (Deleted)
Kristina ScaleZach Sanchez D.O. [facility] [address] [place], [place] [address] Phone: [phone] Subjective:    I'm seeing this patient by the request  of:    CC:   OZH:[id number]:Subjective  Kristina CitronRandi K Sanchez is a 52 y.o. female coming in with complaint of ***  Onset-  Location Duration-  Character- Aggravating factors- Reliving factors-  Therapies tried-  Severity-     Past Medical History:  Diagnosis Date  . ANEMIA-IRON DEFICIENCY 08/24/2009   Qualifier: Diagnosis of  By: Caryl NeverBurchette MD, Bruce    . BACK PAIN, UPPER 10/14/2009   Qualifier: Diagnosis of  By: Caryl NeverBurchette MD, Bruce    . DEPRESSION 08/24/2009   Qualifier: Diagnosis of  By: Caryl NeverBurchette MD, Bruce     No past surgical history on file. Social History   Socioeconomic History  . Marital status: Married    Spouse name: Not on file  . Number of children: Not on file  . Years of education: Not on file  . Highest education level: Not on file  Occupational History  . Not on file  Social Needs  . Financial resource strain: Not on file  . Food insecurity:    Worry: Not on file    Inability: Not on file  . Transportation needs:    Medical: Not on file    Non-medical: Not on file  Tobacco Use  . Smoking status: Never Smoker  . Smokeless tobacco: Never Used  Substance and Sexual Activity  . Alcohol use: Not on file  . Drug use: Not on file  . Sexual activity: Not on file  Lifestyle  . Physical activity:    Days per week: Not on file    Minutes per session: Not on file  . Stress: Not on file  Relationships  . Social connections:    Talks on phone: Not on file    Gets together: Not on file    Attends religious service: Not on file    Active member of club or organization: Not on file    Attends meetings of clubs or organizations: Not on file    Relationship status: Not on file  Other Topics Concern  . Not on file  Social History Narrative  . Not on file   Allergies  Allergen Reactions  . Penicillins    REACTION: hives   Family History  Problem Relation Age of Onset  . Alcohol abuse Other        grandfather  . Arthritis Other        grandmother  . Stroke Other        grandfather, grandfather     Past medical history, social, surgical and family history all reviewed in electronic medical record.  No pertanent information unless stated regarding to the chief complaint.   Review of Systems:Review of systems updated and as accurate as of 03/10/18  No headache, visual changes, nausea, vomiting, diarrhea, constipation, dizziness, abdominal pain, skin rash, fevers, chills, night sweats, weight loss, swollen lymph nodes, body aches, joint swelling, muscle aches, chest pain, shortness of breath, mood changes.   Objective  There were no vitals taken for this visit. Systems examined below as of 03/10/18   General: No apparent distress alert and oriented x3 mood and affect normal, dressed appropriately.  HEENT: Pupils equal, extraocular movements intact  Respiratory: Patient's speak in full sentences and does not appear short of breath  Cardiovascular: No lower extremity edema, non tender, no erythema  Skin: Warm dry intact with no  signs of infection or rash on extremities or on axial skeleton.  Abdomen: Soft nontender  Neuro: Cranial nerves II through XII are intact, neurovascularly intact in all extremities with 2+ DTRs and 2+ pulses.  Lymph: No lymphadenopathy of posterior or anterior cervical chain or axillae bilaterally.  Gait normal with good balance and coordination.  MSK:  Non tender with full range of motion and good stability and symmetric strength and tone of shoulders, elbows, wrist, hip, knee and ankles bilaterally.     Impression and Recommendations:     This case required medical decision making of moderate complexity.      Note: This dictation was prepared with Dragon dictation along with smaller phrase technology. Any transcriptional errors that result from this  process are unintentional.

## 2018-03-11 ENCOUNTER — Ambulatory Visit: Payer: BLUE CROSS/BLUE SHIELD | Admitting: Family Medicine

## 2018-03-11 DIAGNOSIS — Z0289 Encounter for other administrative examinations: Secondary | ICD-10-CM

## 2019-02-24 ENCOUNTER — Encounter: Payer: Self-pay | Admitting: Family Medicine

## 2019-02-24 ENCOUNTER — Other Ambulatory Visit: Payer: Self-pay

## 2019-02-24 ENCOUNTER — Telehealth (INDEPENDENT_AMBULATORY_CARE_PROVIDER_SITE_OTHER): Payer: BC Managed Care – PPO | Admitting: Family Medicine

## 2019-02-24 VITALS — Ht 62.0 in | Wt 143.0 lb

## 2019-02-24 DIAGNOSIS — F439 Reaction to severe stress, unspecified: Secondary | ICD-10-CM | POA: Diagnosis not present

## 2019-02-24 DIAGNOSIS — F5101 Primary insomnia: Secondary | ICD-10-CM | POA: Diagnosis not present

## 2019-02-24 MED ORDER — LORAZEPAM 0.5 MG PO TABS: 0.5000 mg | ORAL_TABLET | Freq: Three times a day (TID) | ORAL | 1 refills | Status: DC | PRN

## 2019-02-24 NOTE — Progress Notes (Signed)
This visit type was conducted due to national recommendations for restrictions regarding the COVID-19 pandemic in an effort to limit this patient's exposure and mitigate transmission in our community.   Virtual Visit via Video Note  I connected with Kristina Sanchez on 02/24/19 at  4:45 PM EDT by a video enabled telemedicine application and verified that I am speaking with the correct person using two identifiers.  Location patient: home Location provider:work or home office Persons participating in the virtual visit: patient, provider  I discussed the limitations of evaluation and management by telemedicine and the availability of in person appointments. The patient expressed understanding and agreed to proceed.   HPI: Patient called with severe situational stressor.  She states that she discovered back May 16 that her husband of 12 years had an affair.  She then recently discovered a second affair.  They are still living together.  She is trying to arrange for some counseling for both of them.  She has not given up on their marriage yet.  She has had severe difficulty with sleep.  She has severe waves of anxiety that have been very difficult to work through.  She is still try to work full-time.  At times she feels like she is having a "panic attack ".  She has not had any past history of documented panic disorder.  Having difficulty falling asleep and staying asleep.  She does not feel unsafe in her current relationship.     ROS: See pertinent positives and negatives per HPI.  Past Medical History:  Diagnosis Date  . ANEMIA-IRON DEFICIENCY 08/24/2009   Qualifier: Diagnosis of  By: Elease Hashimoto MD, Efren Kross    . BACK PAIN, UPPER 10/14/2009   Qualifier: Diagnosis of  By: Elease Hashimoto MD, Reanna Scoggin    . DEPRESSION 08/24/2009   Qualifier: Diagnosis of  By: Elease Hashimoto MD, Biff Rutigliano      No past surgical history on file.  Family History  Problem Relation Age of Onset  . Alcohol abuse Other        grandfather  .  Arthritis Other        grandmother  . Stroke Other        grandfather, grandfather    SOCIAL HX: non-smoker.   Current Outpatient Medications:  .  Diclofenac Sodium 2 % SOLN, Apply 1 pump twice daily. (Patient not taking: Reported on 02/24/2019), Disp: 112 g, Rfl: 3 .  DULoxetine (CYMBALTA) 20 MG capsule, Take 1 capsule (20 mg total) by mouth daily. (Patient not taking: Reported on 02/24/2019), Disp: 30 capsule, Rfl: 3 .  LORazepam (ATIVAN) 0.5 MG tablet, Take 1 tablet (0.5 mg total) by mouth every 8 (eight) hours as needed for anxiety or sleep., Disp: 30 tablet, Rfl: 1  EXAM:  VITALS per patient if applicable:  GENERAL: alert, oriented, appears well and in no acute distress  HEENT: atraumatic, conjunttiva clear, no obvious abnormalities on inspection of external nose and ears  NECK: normal movements of the head and neck  LUNGS: on inspection no signs of respiratory distress, breathing rate appears normal, no obvious gross SOB, gasping or wheezing  CV: no obvious cyanosis  MS: moves all visible extremities without noticeable abnormality  PSYCH/NEURO: pleasant and cooperative, no obvious depression or anxiety, speech and thought processing grossly intact  ASSESSMENT AND PLAN:  Discussed the following assessment and plan:  Situational anxiety/stress  -We have strongly advocated counseling which she is working on currently -We agreed to short-term only use of lorazepam 0.5 mg 1 every 8  hours take at night for severe sleep difficulties or severe anxiety symptoms.  She knows not to take this regularly.    I discussed the assessment and treatment plan with the patient. The patient was provided an opportunity to ask questions and all were answered. The patient agreed with the plan and demonstrated an understanding of the instructions.   The patient was advised to call back or seek an in-person evaluation if the symptoms worsen or if the condition fails to improve as  anticipated.   Evelena PeatBruce Cristiano Capri, MD

## 2019-04-09 ENCOUNTER — Other Ambulatory Visit: Payer: Self-pay

## 2019-04-09 ENCOUNTER — Ambulatory Visit (INDEPENDENT_AMBULATORY_CARE_PROVIDER_SITE_OTHER): Payer: BC Managed Care – PPO | Admitting: Family Medicine

## 2019-04-09 DIAGNOSIS — F418 Other specified anxiety disorders: Secondary | ICD-10-CM | POA: Diagnosis not present

## 2019-04-09 MED ORDER — FLUOXETINE HCL 20 MG PO TABS: 20.0000 mg | ORAL_TABLET | Freq: Every day | ORAL | 1 refills | Status: DC

## 2019-04-09 NOTE — Progress Notes (Signed)
Patient ID: Kristina Sanchez, female   DOB: 03/09/1966, 53 y.o.   MRN: [id number]  This visit type was conducted due to national recommendations for restrictions regarding the COVID-19 pandemic in an effort to limit this patient's exposure and mitigate transmission in our community.   Virtual Visit via Video Note  I connected with Kristina Sanchez on 04/09/19 at  1:15 PM EDT by a video enabled telemedicine application and verified that I am speaking with the correct person using two identifiers.  Location patient: home Location provider:work or home office Persons participating in the virtual visit: patient, provider  I discussed the limitations of evaluation and management by telemedicine and the availability of in person appointments. The patient expressed understanding and agreed to proceed.   HPI: Patient is here for follow-up regarding some recent situational stress and anxiety issues.  Refer to note from 02/24/2019.  She had recently discovered that her husband was having an affair and she had extreme anxiety symptoms.  They are now in counseling together and just started that.  She still has some physical symptoms at times and is having basically almost daily anxiety symptoms and difficulty sleeping.  She is been very guarded with use of lorazepam only for severe anxiety  She feels more anxious than depressed.  She has taken Prozac in the past and tolerated well.  She is trying to walk some for exercise.   ROS: See pertinent positives and negatives per HPI.  Past Medical History:  Diagnosis Date  . ANEMIA-IRON DEFICIENCY 08/24/2009   Qualifier: Diagnosis of  By: Caryl NeverBurchette MD, Chelby Salata    . BACK PAIN, UPPER 10/14/2009   Qualifier: Diagnosis of  By: Caryl NeverBurchette MD, Nicol Herbig    . DEPRESSION 08/24/2009   Qualifier: Diagnosis of  By: Caryl NeverBurchette MD, Darsh Vandevoort      No past surgical history on file.  Family History  Problem Relation Age of Onset  . Alcohol abuse Other        grandfather  . Arthritis Other         grandmother  . Stroke Other        grandfather, grandfather    SOCIAL HX: Married.  Non-smoker.  No regular alcohol use.   Current Outpatient Medications:  .  Diclofenac Sodium 2 % SOLN, Apply 1 pump twice daily. (Patient not taking: Reported on 02/24/2019), Disp: 112 g, Rfl: 3 .  FLUoxetine (PROZAC) 20 MG tablet, Take 1 tablet (20 mg total) by mouth daily., Disp: 90 tablet, Rfl: 1 .  LORazepam (ATIVAN) 0.5 MG tablet, Take 1 tablet (0.5 mg total) by mouth every 8 (eight) hours as needed for anxiety or sleep., Disp: 30 tablet, Rfl: 1  EXAM:  VITALS per patient if applicable:  GENERAL: alert, oriented, appears well and in no acute distress  HEENT: atraumatic, conjunttiva clear, no obvious abnormalities on inspection of external nose and ears  NECK: normal movements of the head and neck  LUNGS: on inspection no signs of respiratory distress, breathing rate appears normal, no obvious gross SOB, gasping or wheezing  CV: no obvious cyanosis  MS: moves all visible extremities without noticeable abnormality  PSYCH/NEURO: pleasant and cooperative, no obvious depression or anxiety, speech and thought processing grossly intact  ASSESSMENT AND PLAN:  Discussed the following assessment and plan:  Situational anxiety/stress  -Continue with regular counseling -We discussed reinitiating Prozac 20 mg once daily -She is using lorazepam only for severe breakthrough anxiety symptoms -Discussed nonpharmacologic factors in managing stress such as exercise and trying to  get adequate sleep    I discussed the assessment and treatment plan with the patient. The patient was provided an opportunity to ask questions and all were answered. The patient agreed with the plan and demonstrated an understanding of the instructions.   The patient was advised to call back or seek an in-person evaluation if the symptoms worsen or if the condition fails to improve as anticipated.   Carolann Littler, MD

## 2019-05-21 ENCOUNTER — Encounter: Payer: Self-pay | Admitting: Family Medicine

## 2019-05-22 MED ORDER — LORAZEPAM 0.5 MG PO TABS: 0.5000 mg | ORAL_TABLET | Freq: Every evening | ORAL | 0 refills | Status: DC | PRN

## 2019-05-22 NOTE — Telephone Encounter (Signed)
Refilled #[id number].   Avoid regular use.

## 2019-06-07 ENCOUNTER — Encounter: Payer: Self-pay | Admitting: Family Medicine

## 2019-06-07 ENCOUNTER — Telehealth: Payer: Self-pay | Admitting: *Deleted

## 2019-06-07 NOTE — Telephone Encounter (Signed)
Keep appt with me (hopefully set up for 30 minutes) and ER evaluation if becomes more acute or severe in the meantime.

## 2019-06-07 NOTE — Telephone Encounter (Signed)
Patient sent a My Chart message :   Dr. Elease Hashimoto, I am experiencing chest pain with difficulty breathing at times. I am unable to determine if this is anxiety based or otherwise. Please advise of your opinion as to what my next steps should be. I do not prefer to go to the hospital at this time if at all possible.  Thank you, [facility] RN triaged patient. She said this has been going on for a while. Patient denies profuse sweating, pain radiating down the arm or increased pressure. I have advised patient to go to ED. Patient has refused and only wanted an appointment with you. The first available was Wednesday at [facility]

## 2019-06-08 NOTE — Telephone Encounter (Signed)
Spoke with patient. Informed her per Dr. Elease Hashimoto keep appt with me  and ER evaluation if becomes more acute or severe in the meantime. Patient verbalized understanding.

## 2019-06-09 ENCOUNTER — Ambulatory Visit: Payer: BC Managed Care – PPO | Admitting: Family Medicine

## 2019-06-09 ENCOUNTER — Encounter: Payer: Self-pay | Admitting: Family Medicine

## 2019-06-09 ENCOUNTER — Other Ambulatory Visit: Payer: Self-pay

## 2019-06-09 VITALS — BP 124/82 | HR 79 | Temp 97.5°F | Ht 62.0 in | Wt 145.8 lb

## 2019-06-09 DIAGNOSIS — R079 Chest pain, unspecified: Secondary | ICD-10-CM

## 2019-06-09 NOTE — Progress Notes (Signed)
Subjective:     Patient ID: Kristina Sanchez, female   DOB: 01/31/1966, 53 y.o.   MRN: [id number]  HPI   Kristina Sanchez has had recent increased stress issues.  She has had some marital concerns and also tremendous job stress.  She has 5 children and they are all out of the home.  She had called yesterday with some increased palpitations and intermittent chest pain.  She wonders if some of this may be related to her situational stress.  She woke up from a nap 1 day last week with some left substernal chest pain.  Symptoms lasted over an hour.  She did not have any radiation.  No nausea.  No diaphoresis.  No dyspnea.  No active GERD symptoms.  Generally very sedentary.  No regular exercise. No recent exertion symptoms with things like house chores/cleaning.   Non-smoker.  No family history of premature CAD.  No history of diabetes.  She has had some occasional palpitations as well.  No clear triggers.  Occur at rest.  Does drink considerable caffeine.  Usually 1 glass of wine per day.  Past Medical History:  Diagnosis Date  . ANEMIA-IRON DEFICIENCY 08/24/2009   Qualifier: Diagnosis of  By: Elease Hashimoto MD, Theus Espin    . BACK PAIN, UPPER 10/14/2009   Qualifier: Diagnosis of  By: Elease Hashimoto MD, Opha Mcghee    . DEPRESSION 08/24/2009   Qualifier: Diagnosis of  By: Elease Hashimoto MD, Yaniv Lage     History reviewed. No pertinent surgical history.  reports that she has never smoked. She has never used smokeless tobacco. No history on file for alcohol and drug. family history includes Alcohol abuse in an other family member; Arthritis in an other family member; Stroke in an other family member. Allergies  Allergen Reactions  . Penicillins     REACTION: hives     Review of Systems  Constitutional: Negative for fatigue.  Eyes: Negative for visual disturbance.  Respiratory: Negative for cough, chest tightness, shortness of breath and wheezing.   Cardiovascular: Positive for chest pain and palpitations. Negative for leg swelling.   Neurological: Negative for dizziness, seizures, syncope, weakness, light-headedness and headaches.       Objective:   Physical Exam Constitutional:      Appearance: She is well-developed.  Eyes:     Pupils: Pupils are equal, round, and reactive to light.  Neck:     Musculoskeletal: Neck supple.     Thyroid: No thyromegaly.     Vascular: No JVD.  Cardiovascular:     Rate and Rhythm: Normal rate and regular rhythm.     Heart sounds: No murmur. No gallop.   Pulmonary:     Effort: Pulmonary effort is normal. No respiratory distress.     Breath sounds: Normal breath sounds. No wheezing or rales.  Neurological:     Mental Status: She is alert.        Assessment:     Patient presents with several week history of atypical chest symptoms and intermittent palpitations.  EKG shows sinus rhythm with no acute ST- T changes.  Her risk factors for CAD would appear to be fairly low though she has not had any recent lipid panel.    Plan:     -We recommended consideration for nuclear stress test to evaluate her recent intermittent/recurrent substernal chest pain. -Discussed gradual reduction in caffeine.  We also explained that alcohol some can sometimes also trigger various palpitations such as PACs or PVCs -Follow-up immediately for any progressive chest pain or  other symptoms such as dyspnea -Also discussed possible fasting lipid panel soon -consider event monitor if palpitations persist.  Kristian Covey MD  Primary Care at [facility]

## 2019-06-09 NOTE — Patient Instructions (Addendum)

## 2019-06-10 ENCOUNTER — Other Ambulatory Visit (HOSPITAL_COMMUNITY)
Admission: RE | Admit: 2019-06-10 | Discharge: 2019-06-10 | Disposition: A | Discharge status: Home / Self Care | Payer: BC Managed Care – PPO | Source: Ambulatory Visit | Attending: Family Medicine | Admitting: Family Medicine

## 2019-06-10 ENCOUNTER — Telehealth (HOSPITAL_COMMUNITY): Payer: Self-pay | Admitting: *Deleted

## 2019-06-10 DIAGNOSIS — Z01812 Encounter for preprocedural laboratory examination: Secondary | ICD-10-CM | POA: Insufficient documentation

## 2019-06-10 DIAGNOSIS — Z20828 Contact with and (suspected) exposure to other viral communicable diseases: Secondary | ICD-10-CM | POA: Diagnosis not present

## 2019-06-10 NOTE — Telephone Encounter (Signed)
Patient given detailed instructions per Myocardial Perfusion Study Information Sheet for the test on 06/14/19. Patient notified to arrive 15 minutes early and that it is imperative to arrive on time for appointment to keep from having the test rescheduled.  If you need to cancel or reschedule your appointment, please call the office within 24 hours of your appointment. . Patient verbalized understanding. Kristina Sanchez    

## 2019-06-11 LAB — NOVEL CORONAVIRUS, NAA (HOSP ORDER, SEND-OUT TO REF LAB; TAT 18-24 HRS): SARS-CoV-2, NAA: NOT DETECTED

## 2019-06-14 ENCOUNTER — Other Ambulatory Visit: Payer: Self-pay

## 2019-06-14 ENCOUNTER — Ambulatory Visit (HOSPITAL_COMMUNITY): Payer: BC Managed Care – PPO | Attending: Cardiovascular Disease

## 2019-06-14 VITALS — Ht 62.0 in | Wt 145.0 lb

## 2019-06-14 DIAGNOSIS — R079 Chest pain, unspecified: Secondary | ICD-10-CM

## 2019-06-14 LAB — MYOCARDIAL PERFUSION IMAGING
Estimated workload: 9.7 METS
Exercise duration (min): 8 min
Exercise duration (sec): 2 s
LV dias vol: 60 mL (ref 46–106)
LV sys vol: 18 mL
MPHR: 167 {beats}/min
Peak HR: 146 {beats}/min
Percent HR: 87 %
Rest HR: 65 {beats}/min
SDS: 2
SRS: 0
SSS: 2
TID: 0.93

## 2019-06-14 MED ORDER — TECHNETIUM TC 99M TETROFOSMIN IV KIT
32.7000 | PACK | Freq: Once | INTRAVENOUS | Status: AC | PRN
  Administered 2019-06-14: 32.7 via INTRAVENOUS
  Filled 2019-06-14: qty 33

## 2019-06-14 MED ORDER — TECHNETIUM TC 99M TETROFOSMIN IV KIT
9.8000 | PACK | Freq: Once | INTRAVENOUS | Status: AC | PRN
  Administered 2019-06-14: 9.8 via INTRAVENOUS
  Filled 2019-06-14: qty 10

## 2019-08-03 ENCOUNTER — Ambulatory Visit: Payer: BC Managed Care – PPO | Attending: Internal Medicine

## 2019-08-03 DIAGNOSIS — Z20822 Contact with and (suspected) exposure to covid-19: Secondary | ICD-10-CM

## 2019-08-05 LAB — NOVEL CORONAVIRUS, NAA: SARS-CoV-2, NAA: DETECTED — AB

## 2019-11-25 ENCOUNTER — Other Ambulatory Visit: Payer: Self-pay | Admitting: Obstetrics and Gynecology

## 2019-11-25 DIAGNOSIS — N644 Mastodynia: Secondary | ICD-10-CM

## 2019-11-30 ENCOUNTER — Other Ambulatory Visit: Payer: Self-pay | Admitting: Obstetrics and Gynecology

## 2019-11-30 ENCOUNTER — Ambulatory Visit: Admission: RE | Admit: 2019-11-30 | Payer: BC Managed Care – PPO | Source: Ambulatory Visit

## 2019-11-30 ENCOUNTER — Ambulatory Visit
Admission: RE | Admit: 2019-11-30 | Discharge: 2019-11-30 | Disposition: A | Discharge status: Home / Self Care | Payer: BC Managed Care – PPO | Source: Ambulatory Visit | Attending: Obstetrics and Gynecology | Admitting: Obstetrics and Gynecology

## 2019-11-30 ENCOUNTER — Other Ambulatory Visit: Payer: Self-pay

## 2019-11-30 DIAGNOSIS — N644 Mastodynia: Secondary | ICD-10-CM

## 2019-11-30 DIAGNOSIS — N6320 Unspecified lump in the left breast, unspecified quadrant: Secondary | ICD-10-CM

## 2019-12-16 LAB — CBC AND DIFFERENTIAL
HCT: 38 (ref 36–46)
Hemoglobin: 12.7 (ref 12.0–16.0)
Platelets: 317 (ref 150–399)
WBC: 7.4

## 2019-12-16 LAB — BASIC METABOLIC PANEL
BUN: 16 (ref 4–21)
Glucose: 99
Sodium: 140 (ref 137–147)

## 2019-12-16 LAB — HEPATIC FUNCTION PANEL
ALT: 34 (ref 7–35)
AST: 30 (ref 13–35)
Alkaline Phosphatase: 107 (ref 25–125)
Bilirubin, Direct: 0.1 (ref 0.01–0.4)
Bilirubin, Total: 0.5

## 2019-12-16 LAB — TSH: TSH: 0.91 (ref 0.41–5.90)

## 2019-12-16 LAB — CBC: RBC: 4.34 (ref 3.87–5.11)

## 2019-12-22 ENCOUNTER — Encounter: Payer: Self-pay | Admitting: Family Medicine

## 2020-06-01 ENCOUNTER — Encounter: Payer: Self-pay | Admitting: Family Medicine

## 2020-06-02 MED ORDER — LORAZEPAM 0.5 MG PO TABS: 0.5000 mg | ORAL_TABLET | Freq: Every evening | ORAL | 0 refills | Status: AC | PRN

## 2020-06-02 NOTE — Telephone Encounter (Signed)
Refilled once.   If anxiety/panic symptoms coming on more frequently we might want to discuss prevention type medications- but I did refill the Lorazepam once.

## 2021-06-15 ENCOUNTER — Encounter: Payer: Self-pay | Admitting: Adult Health

## 2021-06-15 ENCOUNTER — Telehealth (INDEPENDENT_AMBULATORY_CARE_PROVIDER_SITE_OTHER): Payer: Self-pay | Admitting: Adult Health

## 2021-06-15 VITALS — Ht 62.0 in | Wt 158.0 lb

## 2021-06-15 DIAGNOSIS — H6983 Other specified disorders of Eustachian tube, bilateral: Secondary | ICD-10-CM

## 2021-06-15 DIAGNOSIS — J0140 Acute pansinusitis, unspecified: Secondary | ICD-10-CM

## 2021-06-15 MED ORDER — FLUCONAZOLE 150 MG PO TABS: 150.0000 mg | ORAL_TABLET | Freq: Once | ORAL | 1 refills | Status: AC

## 2021-06-15 MED ORDER — DOXYCYCLINE HYCLATE 100 MG PO CAPS: 100.0000 mg | ORAL_CAPSULE | Freq: Two times a day (BID) | ORAL | 0 refills | Status: DC

## 2021-06-15 NOTE — Progress Notes (Signed)
Virtual Visit via Video Note  I connected with Janalee Dane on 06/15/21 at 10:00 AM EST by a video enabled telemedicine application and verified that I am speaking with the correct person using two identifiers.  Location patient: home Location provider:work or home office Persons participating in the virtual visit: patient, provider  I discussed the limitations of evaluation and management by telemedicine and the availability of in person appointments. The patient expressed understanding and agreed to proceed.   HPI: 55 year old female who is being evaluated today for an acute issue.  Her symptoms started roughly 7 days ago and have been becoming progressively worse over the last week.  Symptoms include postnasal drip, rhinorrhea with yellow drainage, somewhat productive cough, low-grade fever, ear pressure and muffled hearing, and mild sinus pain/pressure.  Does report being prone to yeast infections while on antibiotics.  At home she has been using DayQuil/NyQuil to help especially with the cough and finds this to be helpful.   ROS: See pertinent positives and negatives per HPI.  Past Medical History:  Diagnosis Date   ANEMIA-IRON DEFICIENCY 08/24/2009   Qualifier: Diagnosis of  By: Caryl Never MD, Susa Griffins PAIN, UPPER 10/14/2009   Qualifier: Diagnosis of  By: Caryl Never MD, Bruce     DEPRESSION 08/24/2009   Qualifier: Diagnosis of  By: Caryl Never MD, Bruce      History reviewed. No pertinent surgical history.  Family History  Problem Relation Age of Onset   Alcohol abuse Other        grandfather   Arthritis Other        grandmother   Stroke Other        grandfather, grandfather       Current Outpatient Medications:    doxycycline (VIBRAMYCIN) 100 MG capsule, Take 1 capsule (100 mg total) by mouth 2 (two) times daily., Disp: 14 capsule, Rfl: 0   fluconazole (DIFLUCAN) 150 MG tablet, Take 1 tablet (150 mg total) by mouth once for 1 dose., Disp: 1 tablet, Rfl: 1    LORazepam (ATIVAN) 0.5 MG tablet, Take 1 tablet (0.5 mg total) by mouth at bedtime as needed for anxiety or sleep., Disp: 20 tablet, Rfl: 0  EXAM:  VITALS per patient if applicable:  GENERAL: alert, oriented, appears well and in no acute distress  HEENT: atraumatic, conjunttiva clear, no obvious abnormalities on inspection of external nose and ears  NECK: normal movements of the head and neck  LUNGS: on inspection no signs of respiratory distress, breathing rate appears normal, no obvious gross SOB, gasping or wheezing  CV: no obvious cyanosis  MS: moves all visible extremities without noticeable abnormality  PSYCH/NEURO: pleasant and cooperative, no obvious depression or anxiety, speech and thought processing grossly intact  ASSESSMENT AND PLAN:  Discussed the following assessment and plan:  1. Acute non-recurrent pansinusitis -We will treat with doxycycline as she is allergic to penicillin based antibiotics.  Send in Diflucan to prevent yeast infection.  Advise follow-up if no improvement within the next 3 to 4 days - fluconazole (DIFLUCAN) 150 MG tablet; Take 1 tablet (150 mg total) by mouth once for 1 dose.  Dispense: 1 tablet; Refill: 1 - doxycycline (VIBRAMYCIN) 100 MG capsule; Take 1 capsule (100 mg total) by mouth 2 (two) times daily.  Dispense: 14 capsule; Refill: 0  2. Eustachian tube dysfunction, bilateral -He can use Afrin for the next 3 days.  Hopefully resolves once sinus infection has been treated   Shirline Frees, NP  I discussed the assessment and treatment plan with the patient. The patient was provided an opportunity to ask questions and all were answered. The patient agreed with the plan and demonstrated an understanding of the instructions.   The patient was advised to call back or seek an in-person evaluation if the symptoms worsen or if the condition fails to improve as anticipated.   Dorothyann Peng, NP

## 2021-10-12 IMAGING — US US BREAST*L* LIMITED INC AXILLA
1 series · 12 of 12 positions shown · non-contrast
Comparison: Previous exam(s).

CLINICAL DATA: 53-year-old female presenting for diffuse inferior
right breast pain for 2 months. In 0103 she had a cyst aspiration
and was recommended follow-up, but has been lost to follow-up.

EXAM:
DIGITAL DIAGNOSTIC BILATERAL MAMMOGRAM WITH CAD AND TOMO
ULTRASOUND LEFT BREAST

[Series 1: us breast*left* limited inc axilla · 0.07mm/px · 12 of 12 slices shown]
[im 1/12]
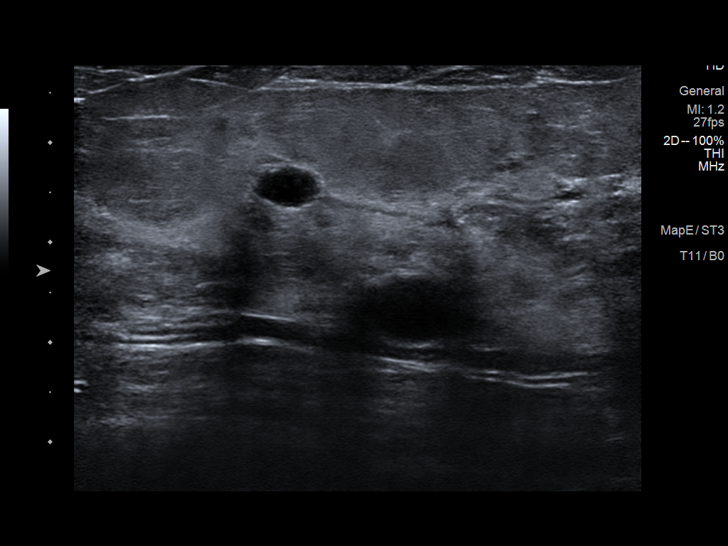
[im 2/12]
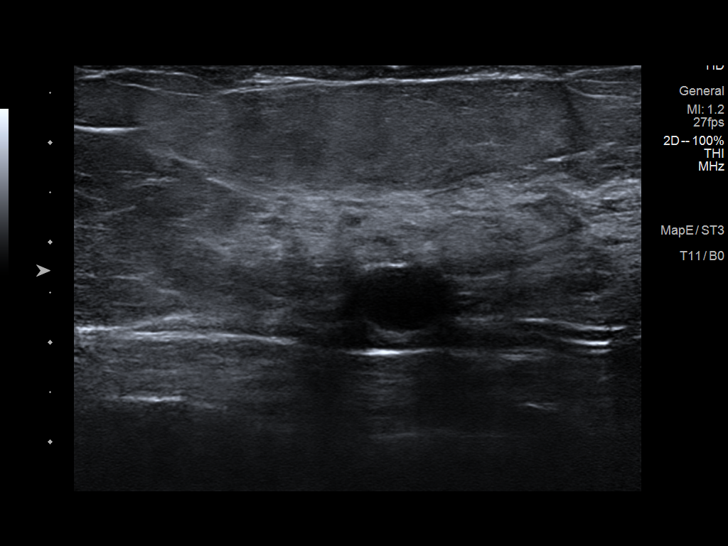
[im 3/12]
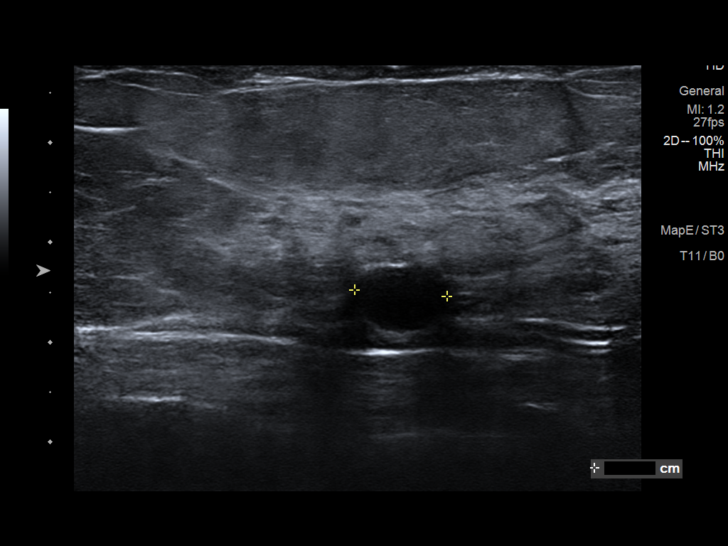
[im 4/12]
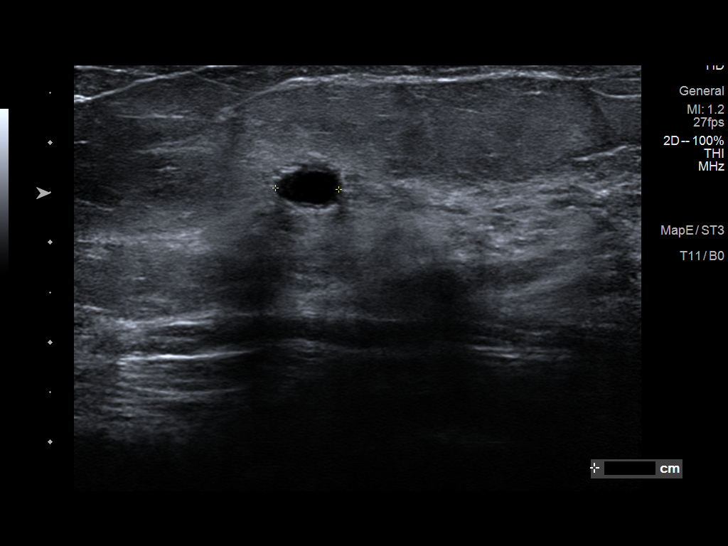
[im 5/12]
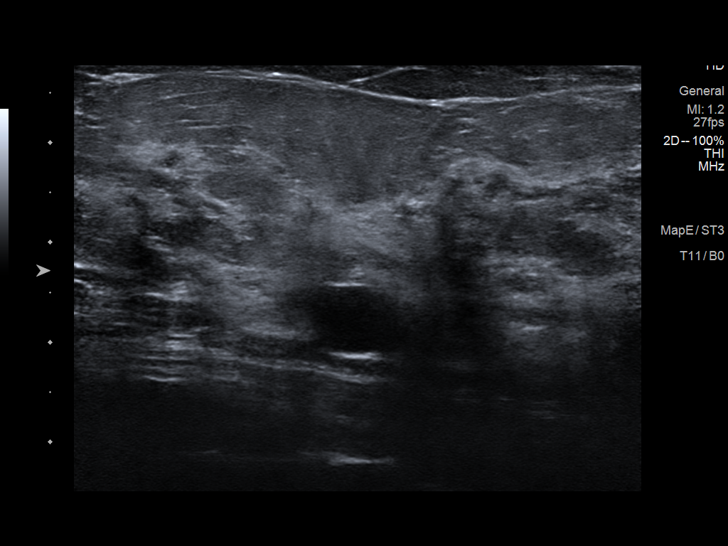
[im 6/12]
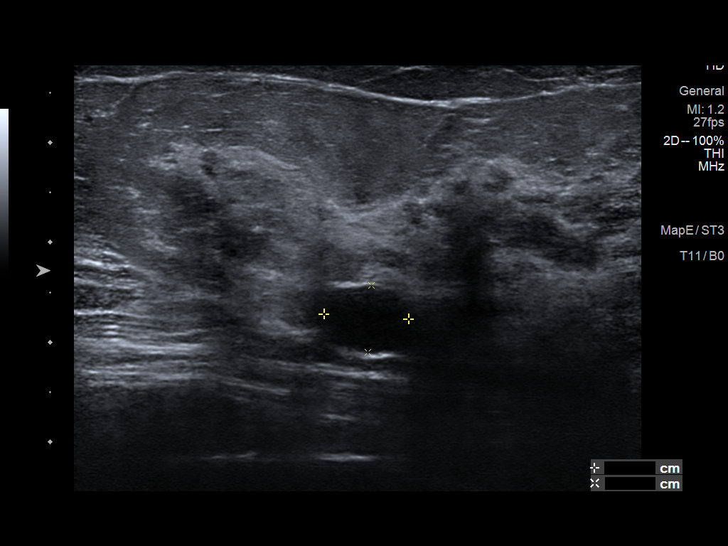
[im 7/12]
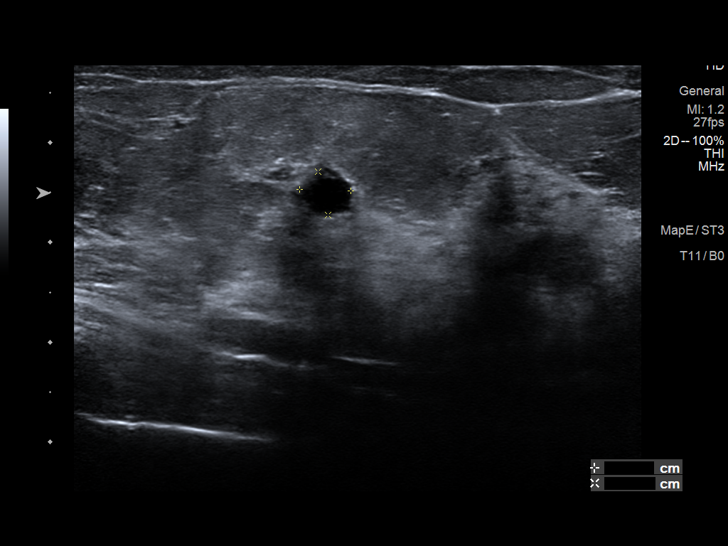
[im 8/12]
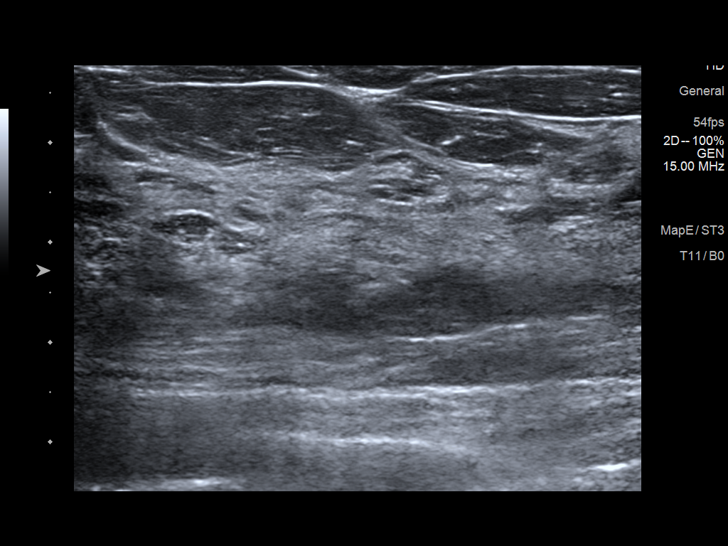
[im 9/12]
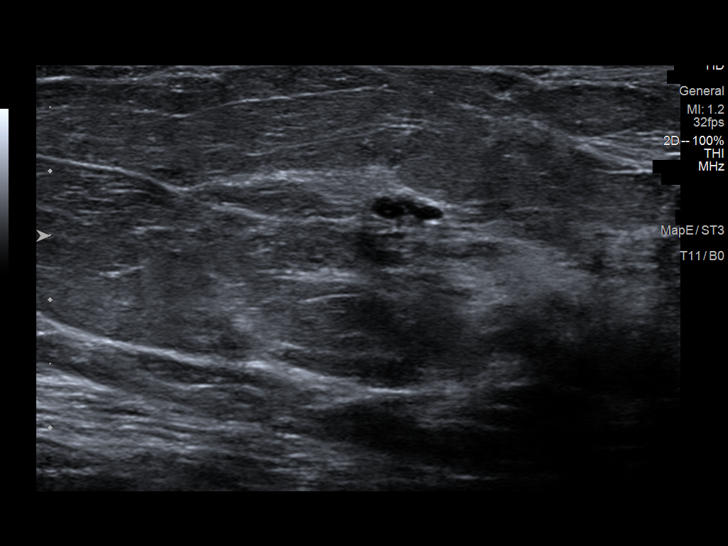
[im 10/12]
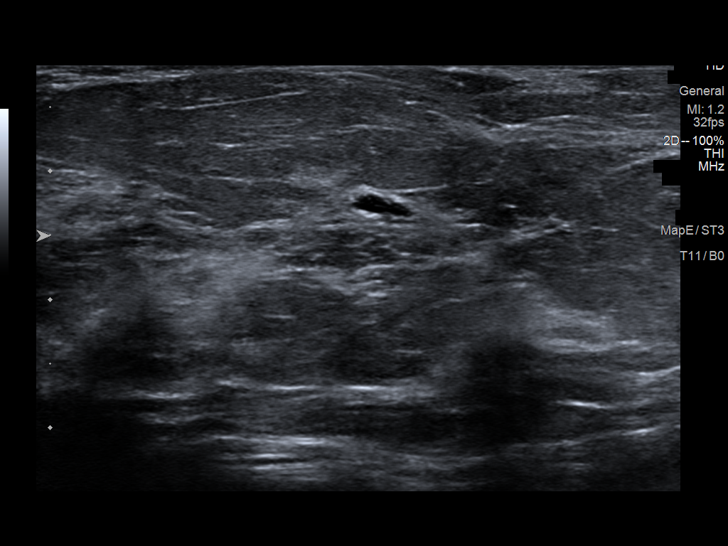
[im 11/12]
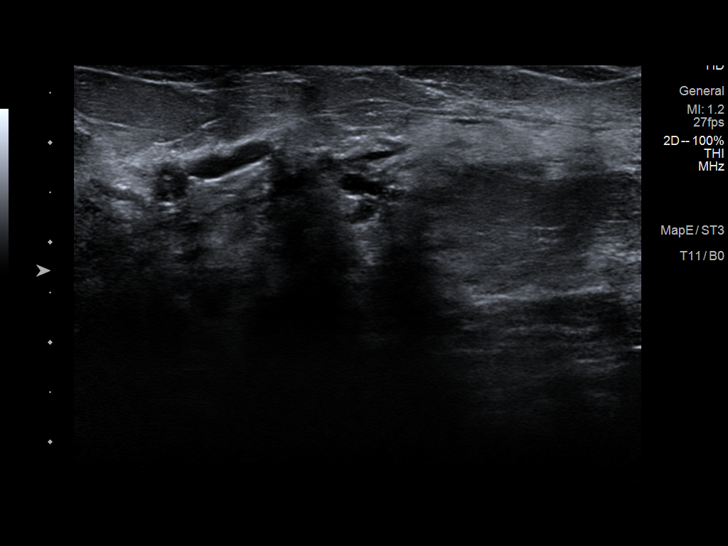
[im 12/12]
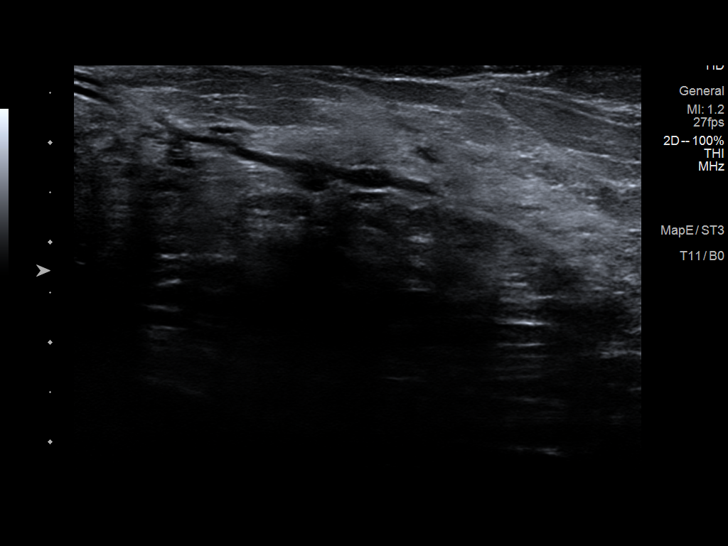

[12 of 12 positions shown; findings below may reference images not displayed]

ACR Breast Density Category c: The breast tissue is heterogeneously
dense, which may obscure small masses.
FINDINGS: In the medial middle depth of the left breast there is a
circumscribed oval mass. In the central to slightly lateral left
breast, middle to posterior depth, there is a possible bilobed
obscured mass. No other suspicious calcifications, masses or areas
of distortion are seen in the bilateral breasts.

Mammographic images were processed with CAD.

Ultrasound targeted to the left breast at 10 o'clock, 2 cm from the
nipple demonstrates 2 adjacent circumscribed anechoic oval masses,
the larger deeper mass measuring 9 x 7 x 9 mm, and the smaller more
superficial measuring 6 x 5 x 5 mm. Ultrasound of the lateral left
breast demonstrates no discrete masses or cysts. A few
benign-appearing dilated ducts are identified. Scattered fibrocystic
changes seen in the inferior left breast.
IMPRESSION: 1. No mammographic or targeted sonographic abnormalities in the
inferior right breast to explain the patient's breast pain.

2. Scattered fibrocystic change noted in the medial and inferior
left breast.

3.  No mammographic evidence of malignancy in the bilateral breasts.

RECOMMENDATION:
1. Clinical follow-up recommended for the tender area of concern in
the inferior right breast. Any further workup should be based on
clinical grounds.

2.  screening mammogram in one year.(Code:[id number])

I have discussed the findings and recommendations with the patient.
If applicable, a reminder letter will be sent to the patient
regarding the next appointment.

BI-RADS CATEGORY  2: Benign.

## 2022-03-13 ENCOUNTER — Encounter: Payer: Self-pay | Admitting: Adult Health

## 2022-03-13 ENCOUNTER — Telehealth (INDEPENDENT_AMBULATORY_CARE_PROVIDER_SITE_OTHER): Payer: Self-pay | Admitting: Adult Health

## 2022-03-13 VITALS — Ht 62.0 in

## 2022-03-13 DIAGNOSIS — W57XXXA Bitten or stung by nonvenomous insect and other nonvenomous arthropods, initial encounter: Secondary | ICD-10-CM

## 2022-03-13 DIAGNOSIS — S40862A Insect bite (nonvenomous) of left upper arm, initial encounter: Secondary | ICD-10-CM

## 2022-03-13 MED ORDER — FLUCONAZOLE 150 MG PO TABS: ORAL_TABLET | ORAL | 0 refills | Status: DC

## 2022-03-13 MED ORDER — DOXYCYCLINE HYCLATE 100 MG PO CAPS: 100.0000 mg | ORAL_CAPSULE | Freq: Two times a day (BID) | ORAL | 0 refills | Status: DC

## 2022-03-13 NOTE — Progress Notes (Signed)
Virtual Visit via Video Note  I connected with Kristina Sanchez on 03/13/22 at  3:30 PM EDT by a video enabled telemedicine application and verified that I am speaking with the correct person using two identifiers.  Location patient: home Location provider:work or home office Persons participating in the virtual visit: patient, provider  I discussed the limitations of evaluation and management by telemedicine and the availability of in person appointments. The patient expressed understanding and agreed to proceed.   HPI: 56 year old female who is being evaluated today for an acute issue.  She reports that 4 to 5 days ago she was walking in her garden and believes that she got bit by a spider on her left upper arm.  She reports that for started with a small bump and some itching and she originally thought it was a mosquito bite but over the last few days it has raised in diameter with NS, and warmth.  She does have a small "bump" near the site of the bite.  It feels less raised today than it did yesterday.  It is slightly painful with palpation.  She has not had any fevers or chills    ROS: See pertinent positives and negatives per HPI.  Past Medical History:  Diagnosis Date   ANEMIA-IRON DEFICIENCY 08/24/2009   Qualifier: Diagnosis of  By: Caryl Never MD, Susa Griffins PAIN, UPPER 10/14/2009   Qualifier: Diagnosis of  By: Caryl Never MD, Bruce     DEPRESSION 08/24/2009   Qualifier: Diagnosis of  By: Caryl Never MD, Bruce      History reviewed. No pertinent surgical history.  Family History  Problem Relation Age of Onset   Alcohol abuse Other        grandfather   Arthritis Other        grandmother   Stroke Other        grandfather, grandfather       Current Outpatient Medications:    LORazepam (ATIVAN) 0.5 MG tablet, Take 1 tablet (0.5 mg total) by mouth at bedtime as needed for anxiety or sleep., Disp: 20 tablet, Rfl: 0  EXAM:  VITALS per patient if applicable:  GENERAL: alert,  oriented, appears well and in no acute distress  HEENT: atraumatic, conjunttiva clear, no obvious abnormalities on inspection of external nose and ears  NECK: normal movements of the head and neck  LUNGS: on inspection no signs of respiratory distress, breathing rate appears normal, no obvious gross SOB, gasping or wheezing  CV: no obvious cyanosis  MS: moves all visible extremities without noticeable abnormality  PSYCH/NEURO: pleasant and cooperative, no obvious depression or anxiety, speech and thought processing grossly intact  SKIN: She does have some localized erythema noted on her left upper arm, about the size of a softball.  No eschar tissue noted.  ASSESSMENT AND PLAN:  Discussed the following assessment and plan:  1. Insect bite of left upper arm, initial encounter -She is allergic to penicillins.  Has used doxycycline in the past, but has a pretty bad yeast infection despite taking 2 tabs of Lamisil. - fluconazole (DIFLUCAN) 150 MG tablet; Take one tab on days 1,3,5,7,10 of antibiotic use  Dispense: 5 tablet; Refill: 0 - doxycycline (VIBRAMYCIN) 100 MG capsule; Take 1 capsule (100 mg total) by mouth 2 (two) times daily.  Dispense: 20 capsule; Refill: 0 -Hives close follow-up if not improving within the next 36 hours      I discussed the assessment and treatment plan with the patient. The patient  was provided an opportunity to ask questions and all were answered. The patient agreed with the plan and demonstrated an understanding of the instructions.   The patient was advised to call back or seek an in-person evaluation if the symptoms worsen or if the condition fails to improve as anticipated.   Dorothyann Peng, NP

## 2023-05-20 ENCOUNTER — Ambulatory Visit: Payer: 59 | Admitting: Adult Health

## 2023-05-20 ENCOUNTER — Encounter: Payer: Self-pay | Admitting: Adult Health

## 2023-05-20 VITALS — BP 120/70 | HR 73 | Temp 98.3°F | Ht 62.0 in | Wt 146.0 lb

## 2023-05-20 DIAGNOSIS — H8112 Benign paroxysmal vertigo, left ear: Secondary | ICD-10-CM | POA: Diagnosis not present

## 2023-05-20 MED ORDER — ONDANSETRON HCL 8 MG PO TABS: 8.0000 mg | ORAL_TABLET | Freq: Three times a day (TID) | ORAL | 1 refills | Status: AC | PRN

## 2023-05-20 NOTE — Progress Notes (Signed)
Subjective:    Patient ID: Kristina Sanchez, female    DOB: March 21, 1966, 57 y.o.   MRN: [id number]  HPI 57 year old female who  has a past medical history of ANEMIA-IRON DEFICIENCY (08/24/2009), BACK PAIN, UPPER (10/14/2009), and DEPRESSION (08/24/2009).  She is a patient of Dr. Caryl Never who I am seeing today for an an acute visit.   She reports that approximately 3 weeks ago she started to develop dizziness dizziness was exacerbated by laying in bed and turning to her left side, getting up and walking, going up and down stairs.  The symptoms lasted for about a week, the next week her symptoms started to improve until she was at church she was doing a lot of bending over and kneeling down and getting up quickly and her symptoms came back.  She continues to have the feeling as "swimmy headed, and dizziness like the world is spinning around her.  She has had some nausea but no vomiting.  At home she tried taking Mucinex but did not find this to be helpful.  Review of Systems See HPI   Past Medical History:  Diagnosis Date   ANEMIA-IRON DEFICIENCY 08/24/2009   Qualifier: Diagnosis of  By: Caryl Never MD, Susa Griffins PAIN, UPPER 10/14/2009   Qualifier: Diagnosis of  By: Caryl Never MD, Bruce     DEPRESSION 08/24/2009   Qualifier: Diagnosis of  By: Caryl Never MD, Bruce      Social History   Socioeconomic History   Marital status: Married    Spouse name: Not on file   Number of children: Not on file   Years of education: Not on file   Highest education level: Not on file  Occupational History   Not on file  Tobacco Use   Smoking status: Never   Smokeless tobacco: Never  Substance and Sexual Activity   Alcohol use: Not on file   Drug use: Not on file   Sexual activity: Not on file  Other Topics Concern   Not on file  Social History Narrative   Not on file   Social Determinants of Health   Financial Resource Strain: Not on file  Food Insecurity: Not on file  Transportation Needs: Not  on file  Physical Activity: Not on file  Stress: Not on file  Social Connections: Not on file  Intimate Partner Violence: Not on file    History reviewed. No pertinent surgical history.  Family History  Problem Relation Age of Onset   Alcohol abuse Other        grandfather   Arthritis Other        grandmother   Stroke Other        grandfather, grandfather    Allergies  Allergen Reactions   Penicillins     REACTION: hives    Current Outpatient Medications on File Prior to Visit  Medication Sig Dispense Refill   LORazepam (ATIVAN) 0.5 MG tablet Take 1 tablet (0.5 mg total) by mouth at bedtime as needed for anxiety or sleep. 20 tablet 0   No current facility-administered medications on file prior to visit.    BP 120/70   Pulse 73   Temp 98.3 F (36.8 C) (Oral)   Ht 5\' 2"  (1.575 m)   Wt 146 lb (66.2 kg)   SpO2 97%   BMI 26.70 kg/m       Objective:   Physical Exam Vitals and nursing note reviewed.  Constitutional:      Appearance:  Normal appearance.  Eyes:     Extraocular Movements:     Right eye: No nystagmus.     Left eye: Nystagmus (horizontal) present.  Cardiovascular:     Rate and Rhythm: Normal rate and regular rhythm.     Pulses: Normal pulses.     Heart sounds: Normal heart sounds.  Pulmonary:     Effort: Pulmonary effort is normal.     Breath sounds: Normal breath sounds.  Musculoskeletal:        General: Normal range of motion.  Skin:    General: Skin is warm and dry.  Neurological:     General: No focal deficit present.     Mental Status: She is alert and oriented to person, place, and time.     Cranial Nerves: Cranial nerves 2-12 are intact.     Sensory: Sensation is intact.     Motor: Motor function is intact.     Coordination: Coordination is intact.     Gait: Gait is intact.  Psychiatric:        Mood and Affect: Mood normal.        Behavior: Behavior normal.        Thought Content: Thought content normal.        Judgment: Judgment  normal.       Assessment & Plan:  1. Benign paroxysmal positional vertigo of left ear -She had horizontal nystagmus in her left eye and was symptomatic on exam today.  No signs of CVA.  We discussed meclizine but she did not want any medication that would make her sleepy.  She would like to hold off on physical therapy at the time being.  I will send in Zofran for her to help with her vertigo-like symptoms.  She will follow-up if symptoms persist towards the end of the week. - ondansetron (ZOFRAN) 8 MG tablet; Take 1 tablet (8 mg total) by mouth every 8 (eight) hours as needed for nausea or vomiting.  Dispense: 20 tablet; Refill: 1  Shirline Frees, NP

## 2024-03-17 ENCOUNTER — Encounter: Payer: Self-pay | Admitting: Surgical Oncology

## 2024-03-17 NOTE — Progress Notes (Addendum)
 Subjective Patient ID: Kristina Sanchez is a 58 y.o. female.   HPI  Presents for consultation breast reduction. Last time she fitted for bra was G cup. Reports several year history neck back and shoulder pain. She has tried specialty fitted bras, NSAIDs, muscle relaxants, injections in back and neck, PT course for over 12 month trial without effect. Denies numbness hands. Reports pain in neck radiates to arms. Denies rashes. Reports associated HA.   Wt- on glutide medication for few months, reports no significant weight loss on this.  MMG- last few years ago. States she does not believe in them. Hx prior benign right breast cyst aspiration. Denies FH breast or ovarian ca.  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  Review of Systems  Constitutional:  Positive for fatigue.  Musculoskeletal:  Positive for arthralgias, back pain and neck pain.  Neurological:  Positive for headaches.  Psychiatric/Behavioral:  Positive for sleep disturbance.   All other systems reviewed and are negative.   Objective Physical Exam  Cardiovascular: Normal rate.   Pulmonary/Chest Effort normal.   Skin  Fitzpatrick 2   Psychiatric She has a normal mood and affect.    Lymph: no palpable axillary adenopathy  +shoulder grooving Breasts: no palpable masses, grade 3 ptosis bilateral SN to nipple R 28 L 28 cm BW R 22 L 22 cm Nipple to IMF R 15 l 15 cm  Assessment/Plan Diagnoses and all orders for this visit:  Macromastia Chronic neck and back pain  Needs MMG- this is also an insurer requirement. Patient will arrange, usually obtained at [facility].  Chronic neck and back pain and shoulder pain with associated HA in setting of macromastia that has failed conservative management over 12 month trial. The pain is not related to other diagnoses. The pain and rashes interfere with activities of daily living. There is a reasonable likelihood that the  patient's symptoms are primarily due to macromastia. Breast reduction is likely to result in improvement of the chronic pain.  Reviewed reduction with anchor type scars, OP surgery, drains, post operative visits and limitations, recovery. Diminished sensation nipple and breast skin, risk of nipple loss, wound healing problems, asymmetry, incidental carcinoma, changes with wt gain/loss, aging, unacceptable cosmetic appearance reviewed. Reviewed scar maturation over months. Reviewed cannot assure cup size.    Anticipate at least 505 g reduction from each breast.   Pictures taken.  ADDENDUM 9.2.2025  MMG 8.27.2025 BIRADS1

## 2024-03-24 ENCOUNTER — Other Ambulatory Visit: Payer: Self-pay | Admitting: Plastic Surgery

## 2024-03-24 ENCOUNTER — Ambulatory Visit
Admission: RE | Admit: 2024-03-24 | Discharge: 2024-03-24 | Disposition: A | Discharge status: Home / Self Care | Source: Ambulatory Visit | Attending: Plastic Surgery

## 2024-03-24 DIAGNOSIS — Z1231 Encounter for screening mammogram for malignant neoplasm of breast: Secondary | ICD-10-CM

## 2024-04-28 NOTE — H&P (Signed)
 Subjective Patient ID: Kristina Sanchez is a 58 y.o. female.     HPI   Returns for follow up discussion breast reduction. Last time she fitted for bra was G cup. Reports several year history neck back and shoulder pain. She has tried specialty fitted bras, NSAIDs, muscle relaxants, injections in back and neck, PT course for over 12 month trial without effect. Denies numbness hands. Reports pain in neck radiates to arms. Denies rashes. Reports associated HA.    Wt- on glutide medication for few months, reports no significant weight loss on this.   MMG- 03/24/24  BIRADS1 Hx prior benign right breast cyst aspiration. Denies FH breast or ovarian ca.   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   Review of Systems  Constitutional:  Positive for fatigue.  Musculoskeletal:  Positive for arthralgias, back pain and neck pain.  Neurological:  Positive for headaches.  Psychiatric/Behavioral:  Positive for sleep disturbance.   All other systems reviewed and are negative.    Objective Physical Exam  Cardiovascular: Normal rate, regular rhythm and normal heart sounds.    Pulmonary/Chest Effort normal and breath sounds normal.    Skin   Fitzpatrick 2     Lymph: no palpable axillary adenopathy   +shoulder grooving Breasts: no palpable masses, grade 3 ptosis bilateral SN to nipple R 28 L 28 cm BW R 22 L 22 cm Nipple to IMF R 15 l 15 cm   Assessment/Plan Diagnoses and all orders for this visit:   Macromastia Chronic neck and back pain   Hold Wegovy week prior to surgery.   Chronic neck and back pain and shoulder pain with associated HA in setting of macromastia that has failed conservative management over 12 month trial. The pain is not related to other diagnoses. The pain and rashes interfere with activities of daily living. There is a reasonable likelihood that the patient's symptoms are primarily due to macromastia. Breast reduction is likely to  result in improvement of the chronic pain.  Reviewed reduction with anchor type scars, OP surgery, drains, post operative visits and limitations, recovery. Diminished sensation nipple and breast skin, risk of nipple loss, wound healing problems, asymmetry, incidental carcinoma, changes with wt gain/loss, aging, unacceptable cosmetic appearance reviewed. Reviewed scar maturation over months. Reviewed cannot assure cup size.    Additional risks including but not limited to bleeding, seroma, hematoma, damage to adjacent structures, infection, asymmetry, damage to adjacent structures, need for additional procedures, unacceptable cosmetic result, blood clots in legs or lungs reviewed. Completed ASPS consent for breast reduction.   Drain teaching completed. Rx for tramadol given.   Anticipate at least 505 g reduction from each breast.   Earlis Ranks, MD [facility]  Office/ physician access line after hours [phone]

## 2024-05-24 ENCOUNTER — Ambulatory Visit: Admitting: Family Medicine

## 2024-05-24 ENCOUNTER — Encounter: Payer: Self-pay | Admitting: Family Medicine

## 2024-05-24 VITALS — BP 142/80 | HR 74 | Temp 98.1°F | Wt 135.3 lb

## 2024-05-24 DIAGNOSIS — H109 Unspecified conjunctivitis: Secondary | ICD-10-CM

## 2024-05-24 MED ORDER — TOBRAMYCIN 0.3 % OP SOLN: 2.0000 [drp] | OPHTHALMIC | 0 refills | Status: AC

## 2024-05-24 NOTE — Progress Notes (Signed)
   Established Patient Office Visit  Subjective   Patient ID: Kristina Sanchez, female    DOB: June 28, 1966  Age: 58 y.o. MRN: [id number]  Chief Complaint  Patient presents with   Eye Problem    HPI   [name] is seen today with some eye irritation.  Initially started in the right eye then subsequently left.  Thursday she helped take care of some children.  By Thursday night she had some crusting from the right eye and this continued over the weekend.  No blurred vision.  No contact use.  No eye pain.  She has some warm compresses and has had some crusted drainage throughout the day since onset Thursday night  Past Medical History:  Diagnosis Date   ANEMIA-IRON DEFICIENCY 08/24/2009   Qualifier: Diagnosis of  By: Micheal MD, Wolm SANES PAIN, UPPER 10/14/2009   Qualifier: Diagnosis of  By: Micheal MD, Daniella Dewberry     DEPRESSION 08/24/2009   Qualifier: Diagnosis of  By: Micheal MD, Adrean Findlay     History reviewed. No pertinent surgical history.  reports that she has never smoked. She has never used smokeless tobacco. No history on file for alcohol use and drug use. family history includes Alcohol abuse in an other family member; Arthritis in an other family member; Stroke in an other family member. Allergies  Allergen Reactions   Penicillins     REACTION: hives    Review of Systems  Constitutional:  Negative for chills and fever.  Eyes:  Positive for discharge and redness. Negative for blurred vision, double vision and photophobia.      Objective:     BP (!) 142/80 (BP Location: Left Arm, Cuff Size: Normal)   Pulse 74   Temp 98.1 F (36.7 C) (Oral)   Wt 135 lb 4.8 oz (61.4 kg)   SpO2 95%   BMI 24.75 kg/m  BP Readings from Last 3 Encounters:  05/24/24 (!) 142/80  05/20/23 120/70  06/09/19 124/82   Wt Readings from Last 3 Encounters:  05/24/24 135 lb 4.8 oz (61.4 kg)  05/20/23 146 lb (66.2 kg)  06/15/21 158 lb (71.7 kg)      Physical Exam Vitals reviewed.   Constitutional:      General: She is not in acute distress.    Appearance: She is not ill-appearing.  Eyes:     Pupils: Pupils are equal, round, and reactive to light.     Comments: Mild conjunctival erythema.  Cornea appears normal.  Pupils equal round reactive to light.  No significant purulent drainage noted at this time  Cardiovascular:     Rate and Rhythm: Normal rate and regular rhythm.  Neurological:     Mental Status: She is alert.      No results found for any visits on 05/24/24.    The ASCVD Risk score (Arnett DK, et al., 2019) failed to calculate for the following reasons:   Cannot find a previous HDL lab   Cannot find a previous total cholesterol lab    Assessment & Plan:   Conjunctivitis, probably bacterial, involving both eyes.  This appears to be possibly resolving.  Only mild inflammation changes noted at this time  -Continue warm compresses several times daily - Tobrex eyedrops 2 drops to each eye every 4 hours while awake - Touch base if symptoms not fully resolving over the next few days  Wolm Micheal, MD

## 2024-08-13 ENCOUNTER — Encounter (HOSPITAL_BASED_OUTPATIENT_CLINIC_OR_DEPARTMENT_OTHER): Payer: Self-pay

## 2024-08-13 ENCOUNTER — Ambulatory Visit (HOSPITAL_BASED_OUTPATIENT_CLINIC_OR_DEPARTMENT_OTHER): Admit: 2024-08-13 | Admitting: Plastic Surgery

## 2024-08-13 SURGERY — MAMMOPLASTY, REDUCTION
Anesthesia: General | Site: Breast | Laterality: Bilateral
